# Patient Record
Sex: Female | Born: 1985 | Race: White | Hispanic: No | Marital: Married | Smoking: Former smoker
Health system: Southern US, Community
[De-identification: ages and names within clinical notes are randomized; demographics above are authoritative.]

## PROBLEM LIST (undated history)

## (undated) ENCOUNTER — Inpatient Hospital Stay (HOSPITAL_COMMUNITY): Payer: Self-pay

## (undated) DIAGNOSIS — Z803 Family history of malignant neoplasm of breast: Secondary | ICD-10-CM

## (undated) DIAGNOSIS — E0590 Thyrotoxicosis, unspecified without thyrotoxic crisis or storm: Secondary | ICD-10-CM

## (undated) DIAGNOSIS — I471 Supraventricular tachycardia, unspecified: Secondary | ICD-10-CM

## (undated) DIAGNOSIS — R002 Palpitations: Secondary | ICD-10-CM

## (undated) DIAGNOSIS — R079 Chest pain, unspecified: Secondary | ICD-10-CM

## (undated) HISTORY — DX: Chest pain, unspecified: R07.9

## (undated) HISTORY — DX: Supraventricular tachycardia, unspecified: I47.10

## (undated) HISTORY — DX: Palpitations: R00.2

## (undated) HISTORY — DX: Family history of malignant neoplasm of breast: Z80.3

## (undated) HISTORY — DX: Supraventricular tachycardia: I47.1

## (undated) HISTORY — DX: Thyrotoxicosis, unspecified without thyrotoxic crisis or storm: E05.90

## (undated) HISTORY — PX: NO PAST SURGERIES: SHX2092

---

## 2019-08-25 ENCOUNTER — Emergency Department (HOSPITAL_COMMUNITY)
Admission: EM | Admit: 2019-08-25 | Discharge: 2019-08-25 | Disposition: A | Discharge status: Home / Self Care | Payer: Managed Care, Other (non HMO) | Attending: Emergency Medicine | Admitting: Emergency Medicine

## 2019-08-25 ENCOUNTER — Encounter (HOSPITAL_COMMUNITY): Payer: Self-pay | Admitting: Emergency Medicine

## 2019-08-25 ENCOUNTER — Other Ambulatory Visit: Payer: Self-pay

## 2019-08-25 DIAGNOSIS — R0789 Other chest pain: Secondary | ICD-10-CM | POA: Diagnosis not present

## 2019-08-25 DIAGNOSIS — I479 Paroxysmal tachycardia, unspecified: Secondary | ICD-10-CM | POA: Insufficient documentation

## 2019-08-25 DIAGNOSIS — I471 Supraventricular tachycardia: Secondary | ICD-10-CM

## 2019-08-25 DIAGNOSIS — R002 Palpitations: Secondary | ICD-10-CM | POA: Diagnosis present

## 2019-08-25 DIAGNOSIS — F17210 Nicotine dependence, cigarettes, uncomplicated: Secondary | ICD-10-CM | POA: Diagnosis not present

## 2019-08-25 LAB — TROPONIN I (HIGH SENSITIVITY)
Troponin I (High Sensitivity): 5 ng/L (ref <18)
Troponin I (High Sensitivity): 5 ng/L (ref <18)

## 2019-08-25 LAB — BASIC METABOLIC PANEL
Anion gap: 12 (ref 5–15)
BUN: 11 mg/dL (ref 6–20)
CO2: 18 mmol/L — ABNORMAL LOW (ref 22–32)
Calcium: 9.5 mg/dL (ref 8.9–10.3)
Chloride: 111 mmol/L (ref 98–111)
Creatinine, Ser: 0.79 mg/dL (ref 0.44–1.00)
GFR calc Af Amer: >60 mL/min (ref >60)
GFR calc non Af Amer: >60 mL/min (ref >60)
Glucose, Bld: 98 mg/dL (ref 70–99)
Potassium: 3.7 mmol/L (ref 3.5–5.1)
Sodium: 141 mmol/L (ref 135–145)

## 2019-08-25 LAB — CBC
HCT: 41.6 % (ref 36.0–46.0)
Hemoglobin: 14.2 g/dL (ref 12.0–15.0)
MCH: 33.1 pg (ref 26.0–34.0)
MCHC: 34.1 g/dL (ref 30.0–36.0)
MCV: 97 fL (ref 80.0–100.0)
Platelets: 236 10*3/uL (ref 150–400)
RBC: 4.29 MIL/uL (ref 3.87–5.11)
RDW: 12.1 % (ref 11.5–15.5)
WBC: 7.1 10*3/uL (ref 4.0–10.5)
nRBC: 0 % (ref 0.0–0.2)

## 2019-08-25 LAB — I-STAT BETA HCG BLOOD, ED (MC, WL, AP ONLY): I-stat hCG, quantitative: <5 m[IU]/mL (ref <5)

## 2019-08-25 LAB — TSH: TSH: 2.006 u[IU]/mL (ref 0.350–4.500)

## 2019-08-25 MED ORDER — SODIUM CHLORIDE 0.9% FLUSH: 3.0000 mL | Freq: Once | INTRAVENOUS | Status: DC

## 2019-08-25 MED ORDER — ADENOSINE 6 MG/2ML IV SOLN
6.0000 mg | Freq: Once | INTRAVENOUS | Status: DC
  Filled 2019-08-25: qty 2

## 2019-08-25 MED ORDER — ADENOSINE 6 MG/2ML IV SOLN
INTRAVENOUS | Status: AC
  Filled 2019-08-25: qty 4

## 2019-08-25 MED ORDER — ADENOSINE 6 MG/2ML IV SOLN
12.0000 mg | Freq: Once | INTRAVENOUS | Status: DC
  Filled 2019-08-25: qty 4

## 2019-08-25 NOTE — ED Provider Notes (Signed)
Attestation: Medical screening examination/treatment/procedure(s) were conducted as a shared visit with non-physician practitioner(s) and myself.  I personally evaluated the patient during the encounter.   Briefly, the patient is a 34 y.o. female  here for tachycardia and associated chest discomfort found to be in SVT.   Vitals:   08/25/19 0400 08/25/19 0445  BP: 102/72 109/78  Pulse: 72 73  Resp: 17 19  Temp:    SpO2: 98% 98%    CONSTITUTIONAL: Well-appearing, NAD NEURO:  Alert and oriented x 3, no focal deficits EYES:  pupils equal and reactive ENT/NECK:  trachea midline, no JVD CARDIO: Tacky rate,, well-perfused PULM: None labored breathing GI/GU:  Abdomin non-distended MSK/SPINE:  No gross deformities, no edema SKIN:  no rash, atraumatic PSYCH:  Appropriate speech and behavior   EKG Interpretation  Date/Time:  Thursday August 25 2019 04:05:55 EDT Ventricular Rate:  83 PR Interval:    QRS Duration: 99 QT Interval:  382 QTC Calculation: 449 R Axis:   75 Text Interpretation: Sinus rhythm Low voltage, precordial leads RSR' in V1 or V2, right VCD or RVH Nonspecific T abnrm, anterolateral leads RATE IMPROVED AND NOW SINUS Confirmed by Drema Pry [phone]) on 08/25/2019 4:34:29 AM       EKG confirmed SVT. No significant electrolyte derangements or anemia.  Thyroid panel within normal limits.  Multiple vagal maneuvers attempted.  Patient was provided with IV fluids.  On my assessment patient's heart rate had improved from the 170s to the 120s but she was still in a normal sinus/reentry pattern.  I placed the patient in Trendelenburg and continue to hydrate.  Instructed patient to perform breathing exercises.  Within 30 minutes, patient converted to normal sinus rhythm.  Repeat EKG as above.  Stable for discharge.  CRITICAL CARE Performed by: Amadeo Garnet [name] Total critical care time: 15 minutes Critical care time was exclusive of separately billable procedures and  treating other patients. Critical care was necessary to treat or prevent imminent or life-threatening deterioration. Critical care was time spent personally by me on the following activities: development of treatment plan with patient and/or surrogate as well as nursing, discussions with consultants, evaluation of patient's response to treatment, examination of patient, obtaining history from patient or surrogate, ordering and performing treatments and interventions, ordering and review of laboratory studies, ordering and review of radiographic studies, pulse oximetry and re-evaluation of patient's condition.       Tiffany Conn, MD 08/25/19 2244

## 2019-08-25 NOTE — Discharge Instructions (Addendum)
Your heart rhythm has spontaneously converted back into a normal rhythm.  Due to the extended length of your symptoms tonight, and the fact that you have had the symptoms in the past, I recommend that you be seen by cardiologist.  Please contact the group listed.  If your symptoms change or worsen, please return to the emergency department.  It would be advisable to avoid stimulants such as caffeine and energy drinks in the meantime.

## 2019-08-25 NOTE — ED Triage Notes (Signed)
Patient reports central chest pain with palpitations and SOB onset this evening , HR = 170's at triage , no emesis or diaphoresis .

## 2019-08-25 NOTE — ED Provider Notes (Signed)
[facility] EMERGENCY DEPARTMENT Provider Note   CSN: [id number] Arrival date & time: 08/25/19  0134     History Chief Complaint  Patient presents with  . Chest Pain  . Palpitations    Tiffany Wolf is a 34 y.o. female.  Patient presents to the emergency department with a chief complaint of chest pain, shortness of breath, and heart palpitations.  She states that symptoms started around 11 PM tonight.  She has history of the same.  She states that she has always been able to get the symptoms to stop after using vagal maneuvers.  She has never required medical intervention.  She states that she had a half a cup of coffee and some tea earlier this morning.  She denies any stimulant use or energy drinks tonight.  She states that she did drink half a glass of wine.  She denies any other associated symptoms.  The history is provided by the patient. No language interpreter was used.       History reviewed. No pertinent past medical history.  There are no problems to display for this patient.   History reviewed. No pertinent surgical history.   OB History   No obstetric history on file.     No family history on file.  Social History   Tobacco Use  . Smoking status: Current Every Day Smoker  . Smokeless tobacco: Never Used  Substance Use Topics  . Alcohol use: Never  . Drug use: Never    Home Medications Prior to Admission medications   Not on File    Allergies    Penicillins  Review of Systems   Review of Systems  All other systems reviewed and are negative.   Physical Exam Updated Vital Signs BP 125/75 (BP Location: Right Arm)   Pulse (!) 175   Temp 97.6 F (36.4 C) (Oral)   Resp 20   Ht 5\' 3"  (1.6 m)   Wt 55 kg   LMP 08/24/2019   SpO2 100%   BMI 21.48 kg/m   Physical Exam Vitals and nursing note reviewed.  Constitutional:      General: She is not in acute distress.    Appearance: She is well-developed.  HENT:     Head:  Normocephalic and atraumatic.  Eyes:     Conjunctiva/sclera: Conjunctivae normal.  Cardiovascular:     Rate and Rhythm: Regular rhythm. Tachycardia present.     Heart sounds: No murmur.  Pulmonary:     Effort: Pulmonary effort is normal. No respiratory distress.     Breath sounds: Normal breath sounds.  Abdominal:     Palpations: Abdomen is soft.     Tenderness: There is no abdominal tenderness.  Musculoskeletal:     Cervical back: Neck supple.  Skin:    General: Skin is warm and dry.  Neurological:     Mental Status: She is alert.     ED Results / Procedures / Treatments   Labs (all labs ordered are listed, but only abnormal results are displayed) Labs Reviewed  CBC  BASIC METABOLIC PANEL  TSH  I-STAT BETA HCG BLOOD, ED ([facility], [facility], [facility] ONLY)  TROPONIN I (HIGH SENSITIVITY)    EKG EKG Interpretation  Date/Time:  Thursday August 25 2019 04:05:55 EDT Ventricular Rate:  83 PR Interval:    QRS Duration: 99 QT Interval:  382 QTC Calculation: 449 R Axis:   75 Text Interpretation: Sinus rhythm Low voltage, precordial leads RSR' in V1 or V2, right VCD or  RVH Nonspecific T abnrm, anterolateral leads RATE IMPROVED AND NOW SINUS Confirmed by Drema Pry [phone]) on 08/25/2019 4:34:29 AM  ED ECG REPORT  I personally interpreted this EKG   Date: 08/25/2019   Rate: 183  Rhythm: supraventricular tachycardia (SVT)  QRS Axis: normal  Intervals: *  ST/T Wave abnormalities: nonspecific ST/T changes  Conduction Disutrbances:none  Narrative Interpretation:   Old EKG Reviewed: none available   Radiology No results found.  Procedures .Critical Care Performed by: Roxy Horseman, PA-C Authorized by: Roxy Horseman, PA-C   Critical care provider statement:    Critical care time (minutes):  35   Critical care was necessary to treat or prevent imminent or life-threatening deterioration of the following conditions:  Circulatory failure   Critical care was time spent personally by  me on the following activities:  Discussions with consultants, evaluation of patient's response to treatment, examination of patient, ordering and performing treatments and interventions, ordering and review of laboratory studies, ordering and review of radiographic studies, pulse oximetry, re-evaluation of patient's condition, obtaining history from patient or surrogate and review of old charts   (including critical care time)  Medications Ordered in ED Medications  sodium chloride flush (NS) 0.9 % injection 3 mL (has no administration in time range)  adenosine (ADENOCARD) 6 MG/2ML injection 6 mg (has no administration in time range)  adenosine (ADENOCARD) 6 MG/2ML injection 12 mg (has no administration in time range)  adenosine (ADENOCARD) 6 MG/2ML injection (has no administration in time range)    ED Course  I have reviewed the triage vital signs and the nursing notes.  Pertinent labs & imaging results that were available during my care of the patient were reviewed by me and considered in my medical decision making (see chart for details).    MDM Rules/Calculators/A&P                      This patient complains of chest pain, SOB, and palpitations, this involves an extensive number of treatment options, and is a complaint that carries with it a high risk of complications and morbidity.  The differential diagnosis includes dysrhythmia, pulmonary embolism, anxiety, toxidrome, etc.  I ordered, reviewed, and interpreted labs, which included CBC which is normal, basic metabolic panel without any electrolyte derangement, i-STAT hCG is negative, troponins negative x2, and TSH which was normal. I ordered medication normal saline to assist with rate control.  Initial EKG shows SVT with rates in the 180s.  She has failed vagal maneuvers and other noninvasive modalities.  We will start fluids.  Will prep for, cardioversion with adenosine.  Patient seen by discussed with Dr. Eudelia Bunch, heart rate is  slowing, she is now in the 120s.  We will continue with IV fluid and will reassess.  Patient reassessed, she spontaneously converted back into normal sinus rhythm.  With reassuring labs, and no other concerning features or findings tonight, feel the patient can be safely discharged at this time.  Patient understands and agrees the plan.  She is stable and ready for discharge.  Final Clinical Impression(s) / ED Diagnoses Final diagnoses:  SVT (supraventricular tachycardia) [facility])    Rx / DC Orders ED Discharge Orders    None       Roxy Horseman, PA-C 08/25/19 0458    [name], MD 08/25/19 2244

## 2019-08-25 NOTE — ED Notes (Signed)
Family at bedside. 

## 2019-09-01 ENCOUNTER — Encounter: Payer: Self-pay | Admitting: Interventional Cardiology

## 2019-09-01 ENCOUNTER — Ambulatory Visit: Payer: Managed Care, Other (non HMO) | Admitting: Interventional Cardiology

## 2019-09-01 ENCOUNTER — Other Ambulatory Visit: Payer: Self-pay

## 2019-09-01 VITALS — BP 100/50 | HR 75 | Ht 63.0 in | Wt 122.0 lb

## 2019-09-01 DIAGNOSIS — I471 Supraventricular tachycardia: Secondary | ICD-10-CM | POA: Diagnosis not present

## 2019-09-01 MED ORDER — METOPROLOL TARTRATE 25 MG PO TABS
25.0000 mg | ORAL_TABLET | Freq: Two times a day (BID) | ORAL | 3 refills | Status: AC | PRN
Start: 2019-09-01 — End: ?

## 2019-09-01 NOTE — Patient Instructions (Signed)
Medication Instructions:  Your physician has recommended you make the following change in your medication:   START: metoprolol tartrate (lopressor) 25 mg by mouth twice a day AS NEEDED for palpitations  *If you need a refill on your cardiac medications before your next appointment, please call your pharmacy*   Lab Work: None ordered  If you have labs (blood work) drawn today and your tests are completely normal, you will receive your results only by: Marland Kitchen MyChart Message (if you have MyChart) OR . A paper copy in the mail If you have any lab test that is abnormal or we need to change your treatment, we will call you to review the results.   Testing/Procedures: Your physician has requested that you have an echocardiogram. Echocardiography is a painless test that uses sound waves to create images of your heart. It provides your doctor with information about the size and shape of your heart and how well your heart's chambers and valves are working. This procedure takes approximately one hour. There are no restrictions for this procedure.   Follow-Up: You have been referred to follow up with Electrophysiology for SVT in 3 months  Other Instructions

## 2019-09-01 NOTE — Progress Notes (Signed)
Cardiology Office Note   Date:  09/01/2019   ID:  Tiffany Wolf, DOB 1985/11/29, MRN [id number]  PCP:  Patient, No Pcp Per    No chief complaint on file.  palpitations  Wt Readings from Last 3 Encounters:  09/01/19 122 lb (55.3 kg)  08/25/19 121 lb 4.1 oz (55 kg)       History of Present Illness: Tiffany Wolf is a 34 y.o. female who is being seen today for the evaluation of palpitations at the request of [place],*.  She has had palpitations intermittently for many years.  She has had short episodes, managed with Valsalva and time.  Longest was 30 minutes before.   She went to the ER due to palpitations.  No excess caffeine.  She had SVT with rate of 183 at 1 AM.  Slowed to 126 an hour later.  By 4AM, it was back to NSR.     Past Medical History:  Diagnosis Date  . Chest pain   . Palpitations   . SVT (supraventricular tachycardia) (HCC)     No past surgical history on file.   Current Outpatient Medications  Medication Sig Dispense Refill  . fluticasone (FLONASE) 50 MCG/ACT nasal spray Place 2 sprays into both nostrils daily as needed for allergies or rhinitis.    Marland Kitchen ibuprofen (ADVIL) 200 MG tablet Take 600-800 mg by mouth 2 (two) times daily as needed for headache or moderate pain.     No current facility-administered medications for this visit.    Allergies:   Penicillins    Social History:  The patient  reports that she has been smoking. She has never used smokeless tobacco. She reports that she does not drink alcohol or use drugs.   Family History:  The patient's family history includes Heart attack in her father.    ROS:  Please see the history of present illness.   Otherwise, review of systems are positive for episodes of palpitations, stress from overwork.   All other systems are reviewed and negative.    PHYSICAL EXAM: VS:  BP (!) 100/50   Pulse 75   Ht 5\' 3"  (1.6 m)   Wt 122 lb (55.3 kg)   LMP 08/24/2019   SpO2 97%   BMI 21.61 kg/m   , BMI Body mass index is 21.61 kg/m. GEN: Well nourished, well developed, in no acute distress  HEENT: normal  Neck: no JVD, carotid bruits, or masses Cardiac: RRR; no murmurs, rubs, or gallops,no edema  Respiratory:  clear to auscultation bilaterally, normal work of breathing GI: soft, nontender, nondistended, + BS MS: no deformity or atrophy  Skin: warm and dry, no rash Neuro:  Strength and sensation are intact Psych: euthymic mood, full affect   EKG:   The ekg ordered 08/25/19 demonstrates SVT, followed slower rate and then followed by NSR   Recent Labs: 08/25/2019: BUN 11; Creatinine, Ser 0.79; Hemoglobin 14.2; Platelets 236; Potassium 3.7; Sodium 141; TSH 2.006   Lipid Panel No results found for: CHOL, TRIG, HDL, CHOLHDL, VLDL, LDLCALC, LDLDIRECT   Other studies Reviewed: Additional studies/ records that were reviewed today with results demonstrating: ER records reviewed; TSH normal.   ASSESSMENT AND PLAN:  1. SVT: PLan for metoprolol 25 mg PO BID prn palpitations.  If she has more palpitations, would refer to EP for ablation.  I don't think we will be able to titrate metoprolol to a high dose due to her relatively low BP.  2. Check echo to r/o  structural heart disease    Current medicines are reviewed at length with the patient today.  The patient concerns regarding her medicines were addressed.  The following changes have been made:  No change  Labs/ tests ordered today include:  No orders of the defined types were placed in this encounter.   Recommend 150 minutes/week of aerobic exercise Low fat, low carb, high fiber diet recommended  Disposition:   FU in 3 months with EP;  I suspect she will need ablation at some point   Signed, Lance Muss, MD  09/01/2019 4:11 PM    [facility] [address] [place], [place], [place]  [address] Phone: [phone]; Fax: [phone]

## 2019-09-09 ENCOUNTER — Encounter: Payer: Self-pay | Admitting: Interventional Cardiology

## 2019-09-26 ENCOUNTER — Other Ambulatory Visit: Payer: Self-pay

## 2019-09-26 ENCOUNTER — Ambulatory Visit (HOSPITAL_COMMUNITY): Payer: Managed Care, Other (non HMO) | Attending: Cardiology

## 2019-09-26 DIAGNOSIS — I471 Supraventricular tachycardia: Secondary | ICD-10-CM | POA: Diagnosis present

## 2021-08-20 ENCOUNTER — Other Ambulatory Visit: Payer: Self-pay | Admitting: Obstetrics and Gynecology

## 2021-08-20 DIAGNOSIS — N6320 Unspecified lump in the left breast, unspecified quadrant: Secondary | ICD-10-CM

## 2021-08-21 ENCOUNTER — Other Ambulatory Visit: Payer: Self-pay | Admitting: Obstetrics and Gynecology

## 2021-08-21 DIAGNOSIS — N6320 Unspecified lump in the left breast, unspecified quadrant: Secondary | ICD-10-CM

## 2021-09-04 ENCOUNTER — Ambulatory Visit
Admission: RE | Admit: 2021-09-04 | Discharge: 2021-09-04 | Disposition: A | Discharge status: Home / Self Care | Payer: Managed Care, Other (non HMO) | Source: Ambulatory Visit | Attending: Obstetrics and Gynecology | Admitting: Obstetrics and Gynecology

## 2021-09-04 ENCOUNTER — Other Ambulatory Visit: Payer: Self-pay | Admitting: Obstetrics and Gynecology

## 2021-09-04 DIAGNOSIS — N6310 Unspecified lump in the right breast, unspecified quadrant: Secondary | ICD-10-CM

## 2021-09-04 DIAGNOSIS — N6320 Unspecified lump in the left breast, unspecified quadrant: Secondary | ICD-10-CM

## 2021-12-15 ENCOUNTER — Emergency Department (HOSPITAL_BASED_OUTPATIENT_CLINIC_OR_DEPARTMENT_OTHER)
Admission: EM | Admit: 2021-12-15 | Discharge: 2021-12-15 | Disposition: A | Discharge status: Home / Self Care | Payer: Managed Care, Other (non HMO) | Attending: Emergency Medicine | Admitting: Emergency Medicine

## 2021-12-15 ENCOUNTER — Emergency Department (HOSPITAL_BASED_OUTPATIENT_CLINIC_OR_DEPARTMENT_OTHER): Payer: Managed Care, Other (non HMO)

## 2021-12-15 ENCOUNTER — Encounter (HOSPITAL_BASED_OUTPATIENT_CLINIC_OR_DEPARTMENT_OTHER): Payer: Self-pay

## 2021-12-15 ENCOUNTER — Other Ambulatory Visit: Payer: Self-pay

## 2021-12-15 DIAGNOSIS — R059 Cough, unspecified: Secondary | ICD-10-CM | POA: Diagnosis present

## 2021-12-15 DIAGNOSIS — G9331 Postviral fatigue syndrome: Secondary | ICD-10-CM | POA: Insufficient documentation

## 2021-12-15 DIAGNOSIS — J40 Bronchitis, not specified as acute or chronic: Secondary | ICD-10-CM | POA: Diagnosis not present

## 2021-12-15 DIAGNOSIS — Z20822 Contact with and (suspected) exposure to covid-19: Secondary | ICD-10-CM | POA: Diagnosis not present

## 2021-12-15 DIAGNOSIS — R058 Other specified cough: Secondary | ICD-10-CM

## 2021-12-15 LAB — RESP PANEL BY RT-PCR (FLU A&B, COVID) ARPGX2
Influenza A by PCR: NEGATIVE
Influenza B by PCR: NEGATIVE
SARS Coronavirus 2 by RT PCR: NEGATIVE

## 2021-12-15 MED ORDER — ALBUTEROL SULFATE HFA 108 (90 BASE) MCG/ACT IN AERS: 1.0000 | INHALATION_SPRAY | Freq: Four times a day (QID) | RESPIRATORY_TRACT | 0 refills | Status: AC | PRN

## 2021-12-15 MED ORDER — PREDNISONE 20 MG PO TABS: 40.0000 mg | ORAL_TABLET | Freq: Every day | ORAL | 0 refills | Status: DC

## 2021-12-15 NOTE — ED Provider Notes (Signed)
[facility] EMERGENCY DEPT Provider Note   CSN: [id number] Arrival date & time: 12/15/21  1735     History  Chief Complaint  Patient presents with   Cough    Tiffany Wolf is a 36 y.o. female with past medical history significant for previous SVT who presents with concern for fall 2 weeks ago with positive eighth rib fracture.  Patient reports that she had a cough that began a few days before her fall, and she has had ongoing if not worsening fall since then.  She was seen evaluated urgent care earlier today, had a chest x-ray which showed concern for possible small pleural effusion versus hemothorax.  She was advised to come here for a chest CT to further evaluate.  She denies any fever, chills, nausea, vomiting, reports that her sore throat is overall improved but her cough is ongoing.  She has been taking over-the-counter cough drops and honey with no significant improvement of her cough.   Cough      Home Medications Prior to Admission medications   Medication Sig Start Date End Date Taking? Authorizing Provider  albuterol (VENTOLIN HFA) 108 (90 Base) MCG/ACT inhaler Inhale 1-2 puffs into the lungs every 6 (six) hours as needed for wheezing or shortness of breath. 12/15/21  Yes Quindon Denker H, PA-C  predniSONE (DELTASONE) 20 MG tablet Take 2 tablets (40 mg total) by mouth daily. 12/15/21  Yes Bridgette Wolden H, PA-C  fluticasone (FLONASE) 50 MCG/ACT nasal spray Place 2 sprays into both nostrils daily as needed for allergies or rhinitis.    [provider]  ibuprofen (ADVIL) 200 MG tablet Take 600-800 mg by mouth 2 (two) times daily as needed for headache or moderate pain.    [provider]  metoprolol tartrate (LOPRESSOR) 25 MG tablet Take 1 tablet (25 mg total) by mouth 2 (two) times daily as needed (palpitations). 09/01/19   Corky Crafts, MD      Allergies    Penicillins    Review of Systems   Review of Systems  Respiratory:   Positive for cough.   All other systems reviewed and are negative.   Physical Exam Updated Vital Signs BP 116/76 (BP Location: Right Arm)   Pulse 82   Temp 98.7 F (37.1 C) (Oral)   Resp 18   Ht 5\' 3"  (1.6 m)   Wt 55.3 kg   SpO2 100%   BMI 21.60 kg/m  Physical Exam Vitals and nursing note reviewed.  Constitutional:      General: She is not in acute distress.    Appearance: Normal appearance.  HENT:     Head: Normocephalic and atraumatic.  Eyes:     General:        Right eye: No discharge.        Left eye: No discharge.  Cardiovascular:     Rate and Rhythm: Normal rate and regular rhythm.     Heart sounds: No murmur heard.    No friction rub. No gallop.  Pulmonary:     Effort: Pulmonary effort is normal.     Breath sounds: Normal breath sounds.     Comments: Occasional somewhat wet cough. TTP right lateral chest wall.  No wheezing, rhonchi, rales, focal consolidation noted.  No significant respiratory distress. Abdominal:     General: Bowel sounds are normal.     Palpations: Abdomen is soft.  Skin:    General: Skin is warm and dry.     Capillary Refill: Capillary refill  takes less than 2 seconds.  Neurological:     Mental Status: She is alert and oriented to person, place, and time.  Psychiatric:        Mood and Affect: Mood normal.        Behavior: Behavior normal.     ED Results / Procedures / Treatments   Labs (all labs ordered are listed, but only abnormal results are displayed) Labs Reviewed  RESP PANEL BY RT-PCR (FLU A&B, COVID) [id number]    EKG None  Radiology CT Chest Wo Contrast  Result Date: 12/15/2021 CLINICAL DATA:  Productive cough with shortness of breath and fatigue. EXAM: CT CHEST WITHOUT CONTRAST TECHNIQUE: Multidetector CT imaging of the chest was performed following the standard protocol without IV contrast. RADIATION DOSE REDUCTION: This exam was performed according to the departmental dose-optimization program which includes automated  exposure control, adjustment of the mA and/or kV according to patient size and/or use of iterative reconstruction technique. COMPARISON:  None Available. FINDINGS: Cardiovascular: No significant vascular findings. Normal heart size. A small, predominantly posteromedial pericardial effusion is noted. Mediastinum/Nodes: Subcentimeter AP window and pretracheal lymph nodes are seen. Thyroid gland, trachea, and esophagus demonstrate no significant findings. Lungs/Pleura: Mild biapical scarring and/or atelectasis is seen, right greater than left. A 7 mm noncalcified pleural based lung nodule is seen within the posterior aspect of the left lower lobe (axial CT image 97, CT series 4). An additional 5 mm noncalcified left lower lobe lung nodule is noted (axial CT image 109, CT series 4). Mild linear scarring and/or atelectasis is seen within the posteromedial aspect of the left lower lobe. There is no evidence of a pleural effusion or pneumothorax. Upper Abdomen: A 10 mm diameter cyst is seen within the right lobe of the liver. Musculoskeletal: No chest wall mass or suspicious bone lesions identified. IMPRESSION: 1. Small, predominantly posteromedial pericardial effusion. 2. Mild biapical scarring and/or atelectasis, right greater than left. 3. Two noncalcified left lower lobe lung nodules, the largest of which measures 7 mm. Non-contrast chest CT at 3-6 months is recommended. If the nodules are stable at time of repeat CT, then future CT at 18-24 months (from today's scan) is considered optional for low-risk patients, but is recommended for high-risk patients. This recommendation follows the consensus statement: Guidelines for Management of Incidental Pulmonary Nodules Detected on CT Images: From the Fleischner Society 2017; Radiology 2017; 284:228-243. Electronically Signed   By: Virgina Norfolk M.D.   On: 12/15/2021 19:38    Procedures Procedures    Medications Ordered in ED Medications - No data to display  ED  Course/ Medical Decision Making/ A&P                           Medical Decision Making Amount and/or Complexity of Data Reviewed Radiology: ordered.   This patient is a 36 y.o. female who presents to the ED for concern of cough, possible fluid on chest x-ray, status post known rib fracture, this involves an extensive number of treatment options, and is a complaint that carries with it a high risk of complications and morbidity. The emergent differential diagnosis prior to evaluation includes, but is not limited to, pneumonia, acute bronchitis, postviral cough syndrome, asthma or COPD exacerbation, versus hemothorax, pleural effusion versus other.   This is not an exhaustive differential.   Past Medical History / Co-morbidities / Social History: Patient does vape tobacco, denies smoke tobacco use  Additional history: Chart reviewed. Pertinent results include:  Reviewed the imaging, and evaluation in the urgent care earlier today  Physical Exam: Physical exam performed. The pertinent findings include: Patient with occasional wet cough, no significant wheezing on my evaluation  Lab Tests: I ordered, and personally interpreted labs.  The pertinent results include: COVID, flu PCR is negative   Imaging Studies: I ordered imaging studies including ET chest without contrast. I independently visualized and interpreted imaging which showed small posterolateral pericardial effusion, some lung nodules noted with recommendation for repeat CT scan in 3 to 6 months, no evidence of hemothorax, or significant pleural effusion. I agree with the radiologist interpretation.   Disposition: After consideration of the diagnostic results and the patients response to treatment, I feel that patient's symptoms are consistent with postviral cough versus ongoing bronchitis.  As it has been 2+ weeks in the setting of known rib fracture with ongoing cough, and wet cough on my exam today to be reasonable to treat with an  inhaler to help with shortness of breath, and patient reported wheezing as well as short burst of steroids to help with lung inflammation.   emergency department workup does not suggest an emergent condition requiring admission or immediate intervention beyond what has been performed at this time. The plan is: Patient to establish primary care doctor to evaluate her lung nodules, encouraged her to stop vaping, we will treat her cough as discussed above. The patient is safe for discharge and has been instructed to return immediately for worsening symptoms, change in symptoms or any other concerns.  I discussed this case with my attending physician Dr. Rosalia Hammers who cosigned this note including patient's presenting symptoms, physical exam, and planned diagnostics and interventions. Attending physician stated agreement with plan or made changes to plan which were implemented.    Final Clinical Impression(s) / ED Diagnoses Final diagnoses:  Post-viral cough syndrome  Bronchitis    Rx / DC Orders ED Discharge Orders          Ordered    albuterol (VENTOLIN HFA) 108 (90 Base) MCG/ACT inhaler  Every 6 hours PRN        12/15/21 2012    predniSONE (DELTASONE) 20 MG tablet  Daily        12/15/21 2012              [place] 12/15/21 2017    Margarita Grizzle, MD 12/17/21 1246

## 2021-12-15 NOTE — Discharge Instructions (Addendum)
Here is what your CT showed:  Two noncalcified left lower lobe lung nodules, the largest of  which measures 7 mm. Non-contrast chest CT at 3-6 months is  recommended. If the nodules are stable at time of repeat CT, then  future CT at 18-24 months (from today's scan) is considered optional  for low-risk patients, but is recommended for high-risk patients.  This recommendation follows the consensus statement: Guidelines for  Management of Incidental Pulmonary Nodules Detected on CT Images:  From the Fleischner Society 2017; Radiology 2017; 284:228-243.   Please establish a primary care doctor at your earliest convenience to follow-up on the above findings.  In the meantime you can try the albuterol inhaler and short course of steroids as we discussed for your postviral cough/acute bronchitis.  Continue use to your incentive spirometer until you are cough and rib pain free.  Please return to the emergency department if you have worsening symptoms, difficulty breathing, chest pain.

## 2021-12-15 NOTE — ED Triage Notes (Signed)
Patient here POV from Home.  Endorses falling approximately 2 Weeks ago. Patient was assessed by [facility] a few days after the Fall and was informed she had Fractured Ribs and was given a Facilities manager.   Patient visited UC today as Patient has been experiencing a Productive Cough, Fatigue.   No Confirmed Fevers. Still Some Discomfort to Ribs from Fall.   NAD Noted during Triage. A&Ox4. GCS 15. Ambulatory.

## 2021-12-15 NOTE — ED Notes (Signed)
RT educated pt on smoking cessation. Pt stated she utilizes e-cigarettes regularly. RT educated the pt on various methods of quitting and methods available to her. Pt also educated on VILI r/t e-cigarettes and vaping. Pt receptive to teaching and verbalizes understanding of information provided.

## 2022-04-30 ENCOUNTER — Other Ambulatory Visit: Payer: Self-pay | Admitting: Nurse Practitioner

## 2022-04-30 DIAGNOSIS — J984 Other disorders of lung: Secondary | ICD-10-CM

## 2022-06-03 ENCOUNTER — Ambulatory Visit: Payer: Managed Care, Other (non HMO)

## 2022-07-14 ENCOUNTER — Ambulatory Visit
Admission: RE | Admit: 2022-07-14 | Discharge: 2022-07-14 | Disposition: A | Discharge status: Home / Self Care | Payer: Managed Care, Other (non HMO) | Source: Ambulatory Visit | Attending: Nurse Practitioner | Admitting: Nurse Practitioner

## 2022-07-14 DIAGNOSIS — J984 Other disorders of lung: Secondary | ICD-10-CM

## 2022-11-12 IMAGING — US US BREAST*R* LIMITED INC AXILLA
1 series · 11 of 11 positions shown · non-contrast
Comparison: Prior exams are not available, performed [facility]
COMPARISON: Prior exams are not available, performed at [facility]

Addendum:
CLINICAL DATA: Palpable abnormality in the RIGHT breast, confirmed
by patient's provider. No palpable abnormality on the LEFT,
confirmed with patient by technologist.

EXAM:
DIGITAL DIAGNOSTIC BILATERAL MAMMOGRAM WITH TOMOSYNTHESIS AND CAD;
ULTRASOUND RIGHT BREAST LIMITED
TECHNIQUE: Bilateral digital diagnostic mammography and breast tomosynthesis
was performed. The images were evaluated with computer-aided
detection.; Targeted ultrasound examination of the right breast was
performed

[Series 1: us breast*right* limited inc axilla · 0.06mm/px · 11 of 11 slices shown]
[im 1/11]
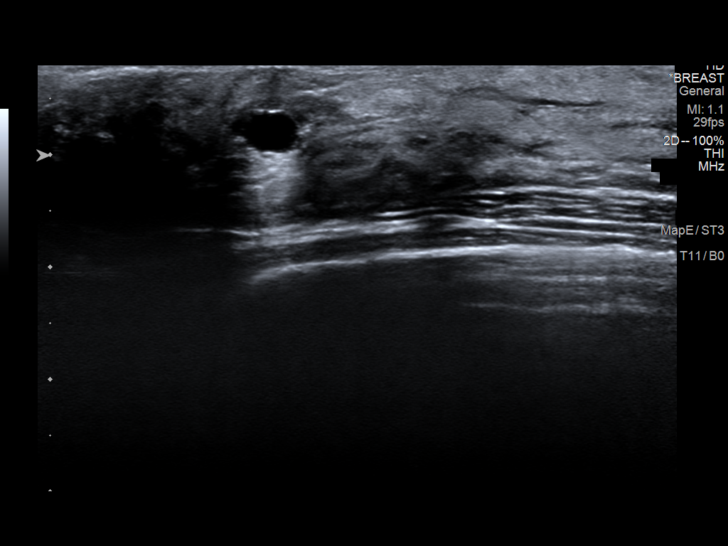
[im 2/11]
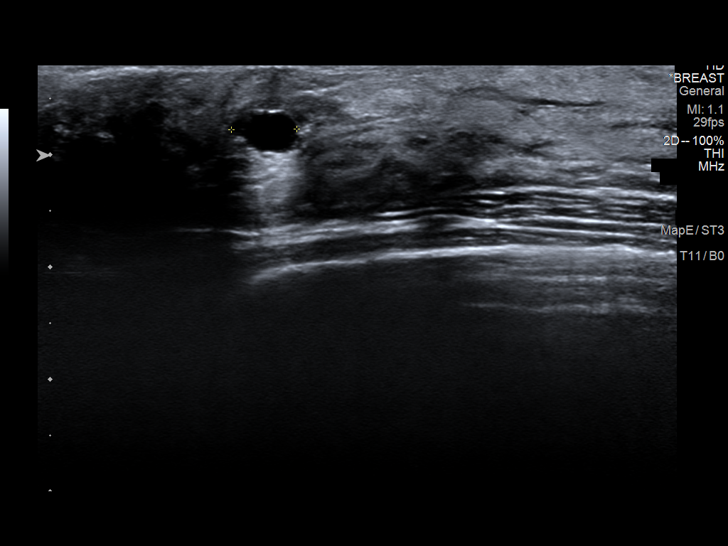
[im 3/11]
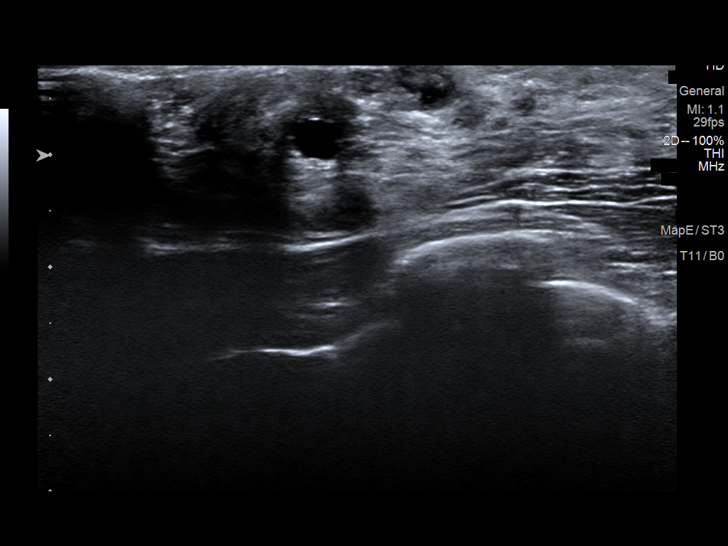
[im 4/11]
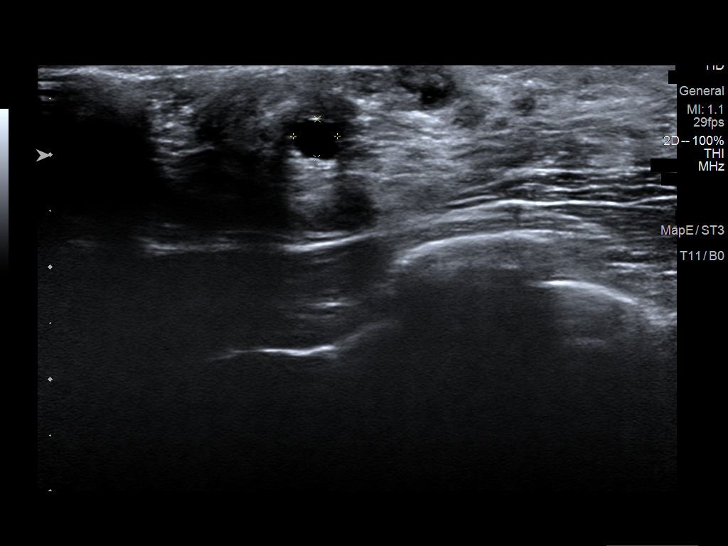
[im 5/11]
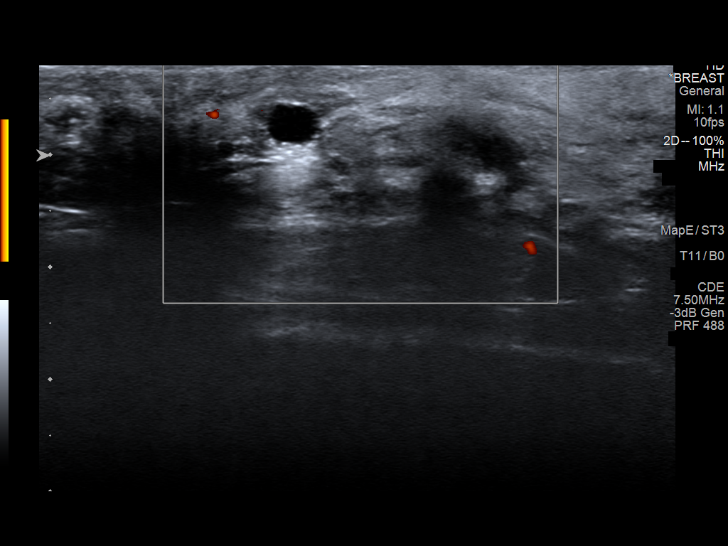
[im 6/11]
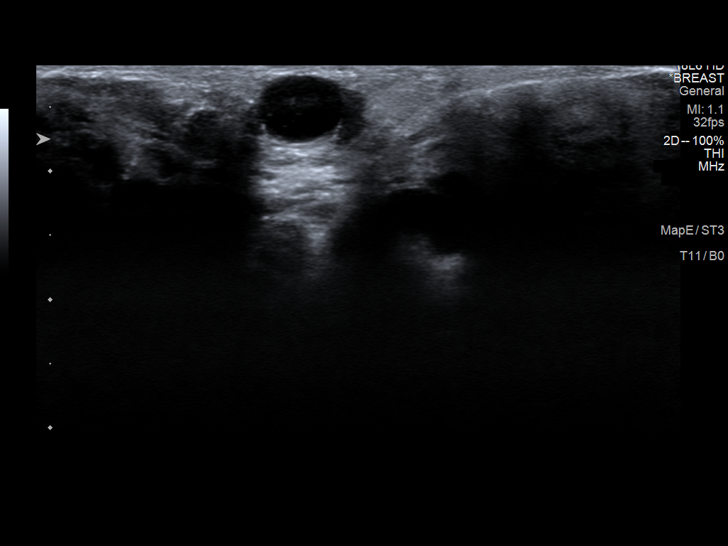
[im 7/11]
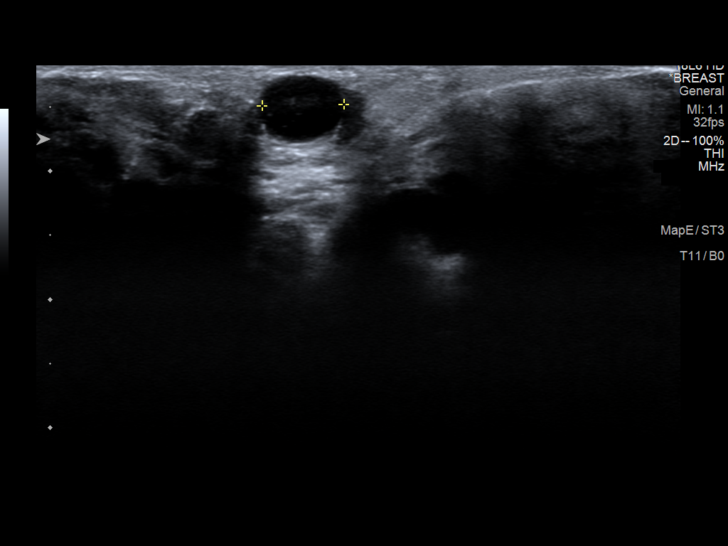
[im 8/11]
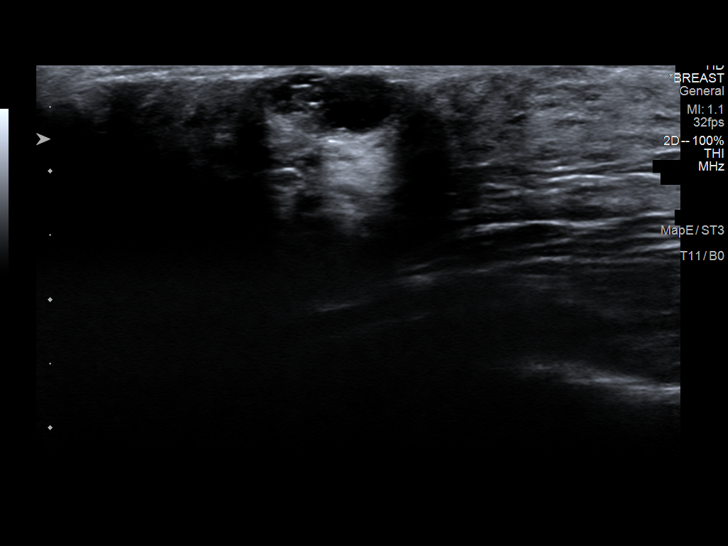
[im 9/11]
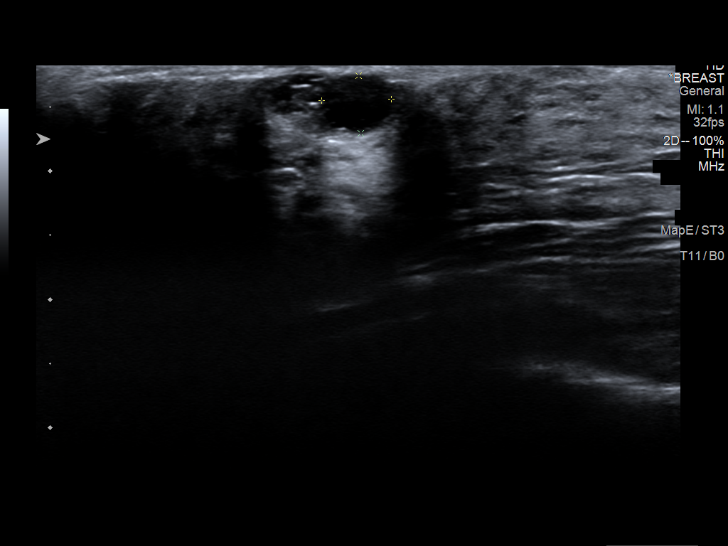
[im 10/11]
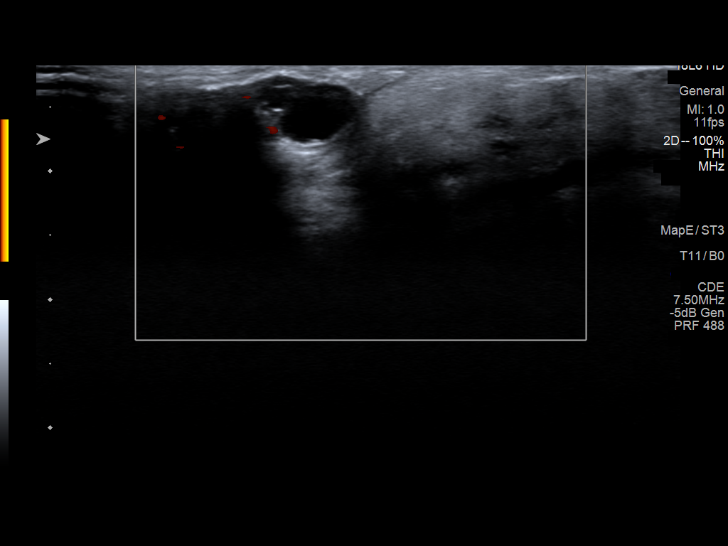
[im 11/11]
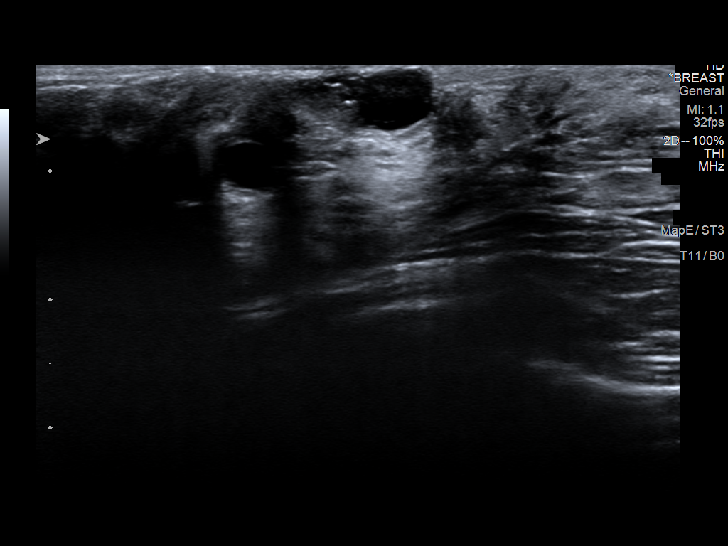

[11 of 11 positions shown; findings below may reference images not displayed]

ACR Breast Density Category d: The breast tissue is extremely dense,
which lowers the sensitivity of mammography.
FINDINGS: LEFT breast is negative.

BB marks the area concern in the UPPER-OUTER QUADRANT of the RIGHT
breast. Spot tangential view shows a partially obscured oval mass in
the area of concern. No associated distortion or suspicious
microcalcifications.

On physical exam, I palpate a small oval mobile smooth mass in the
UPPER-OUTER QUADRANT of the RIGHT breast.

Targeted ultrasound is performed, showing adjacent simple cysts
within the 10 o'clock location of the RIGHT breast 5 centimeters
from the nipple, measuring up to 0.6 centimeters. No solid component
or areas of acoustic shadowing.
IMPRESSION: Palpable cysts in the UPPER-OUTER QUADRANT of the RIGHT breast. No
mammographic or ultrasound evidence for malignancy.

RECOMMENDATION:
If prior studies are obtained, addendum can be made regarding
further recommendations. Otherwise, recommend screening mammogram at
age 40 unless there are persistent or intervening clinical concerns.
(Code:BA-W-O3E)

I have discussed the findings and recommendations with the patient.
If applicable, a reminder letter will be sent to the patient
regarding the next appointment.

BI-RADS CATEGORY  2: Benign.

ADDENDUM:
Comparison is now made with previous exams, performed at [facility]. No interval changes to suggest
malignancy.

Recommendation: Recommend screening mammogram at age 40 unless there
are persistent or intervening clinical concerns. (Code:BA-[id number])

BI-RADS: 2: Benign.

*** End of Addendum ***
ACR Breast Density Category d: The breast tissue is extremely dense,
which lowers the sensitivity of mammography.
FINDINGS: LEFT breast is negative.

BB marks the area concern in the UPPER-OUTER QUADRANT of the RIGHT
breast. Spot tangential view shows a partially obscured oval mass in
the area of concern. No associated distortion or suspicious
microcalcifications.

On physical exam, I palpate a small oval mobile smooth mass in the
UPPER-OUTER QUADRANT of the RIGHT breast.

Targeted ultrasound is performed, showing adjacent simple cysts
within the 10 o'clock location of the RIGHT breast 5 centimeters
from the nipple, measuring up to 0.6 centimeters. No solid component
or areas of acoustic shadowing.
IMPRESSION: Palpable cysts in the UPPER-OUTER QUADRANT of the RIGHT breast. No
mammographic or ultrasound evidence for malignancy.

RECOMMENDATION:
If prior studies are obtained, addendum can be made regarding
further recommendations. Otherwise, recommend screening mammogram at
age 38 unless there are persistent or intervening clinical concerns.
(Code:[id number])

I have discussed the findings and recommendations with the patient.
If applicable, a reminder letter will be sent to the patient
regarding the next appointment.

BI-RADS CATEGORY  2: Benign.

## 2022-11-12 IMAGING — MG DIGITAL DIAGNOSTIC BILAT W/ TOMO W/ CAD
6 of 10 series · 6 of 30 positions shown · non-contrast
Comparison: Prior exams are not available, performed at [facility]
COMPARISON: Prior exams are not available, performed at [facility]

Addendum:
CLINICAL DATA: Palpable abnormality in the RIGHT breast, confirmed
by patient's provider. No palpable abnormality on the LEFT,
confirmed with patient by technologist.

EXAM:
DIGITAL DIAGNOSTIC BILATERAL MAMMOGRAM WITH TOMOSYNTHESIS AND CAD;
ULTRASOUND RIGHT BREAST LIMITED
TECHNIQUE: Bilateral digital diagnostic mammography and breast tomosynthesis
was performed. The images were evaluated with computer-aided
detection.; Targeted ultrasound examination of the right breast was
performed

[R CC synth-2D]
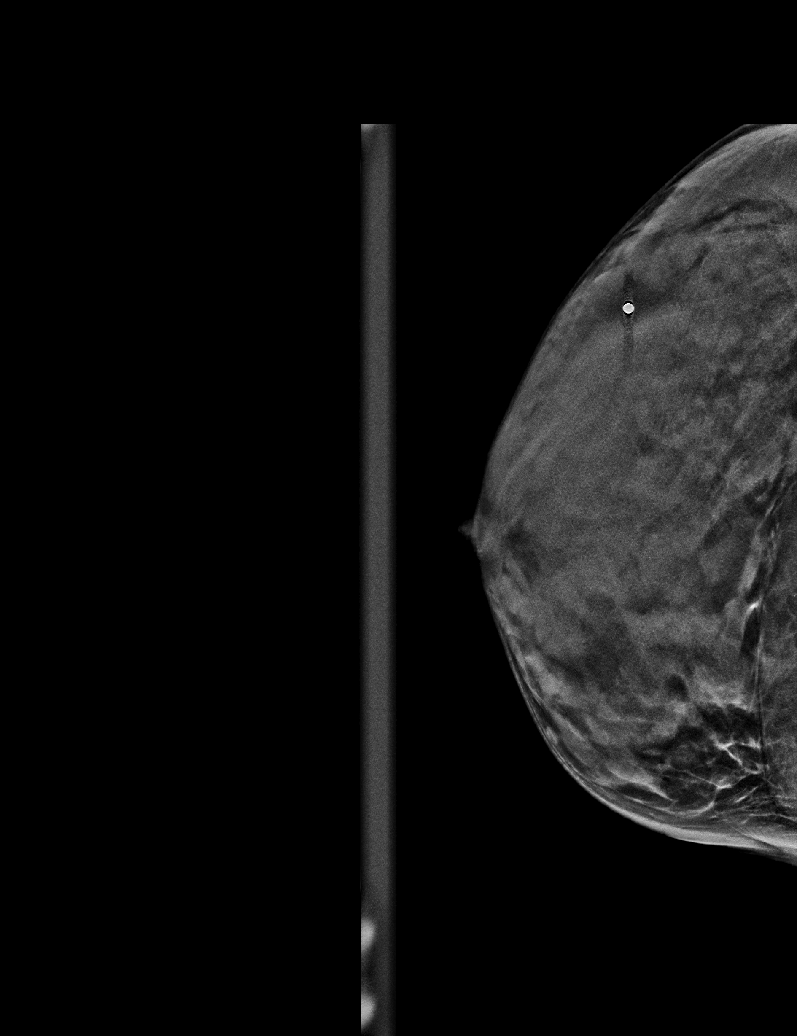

[R TAN synth-2D]
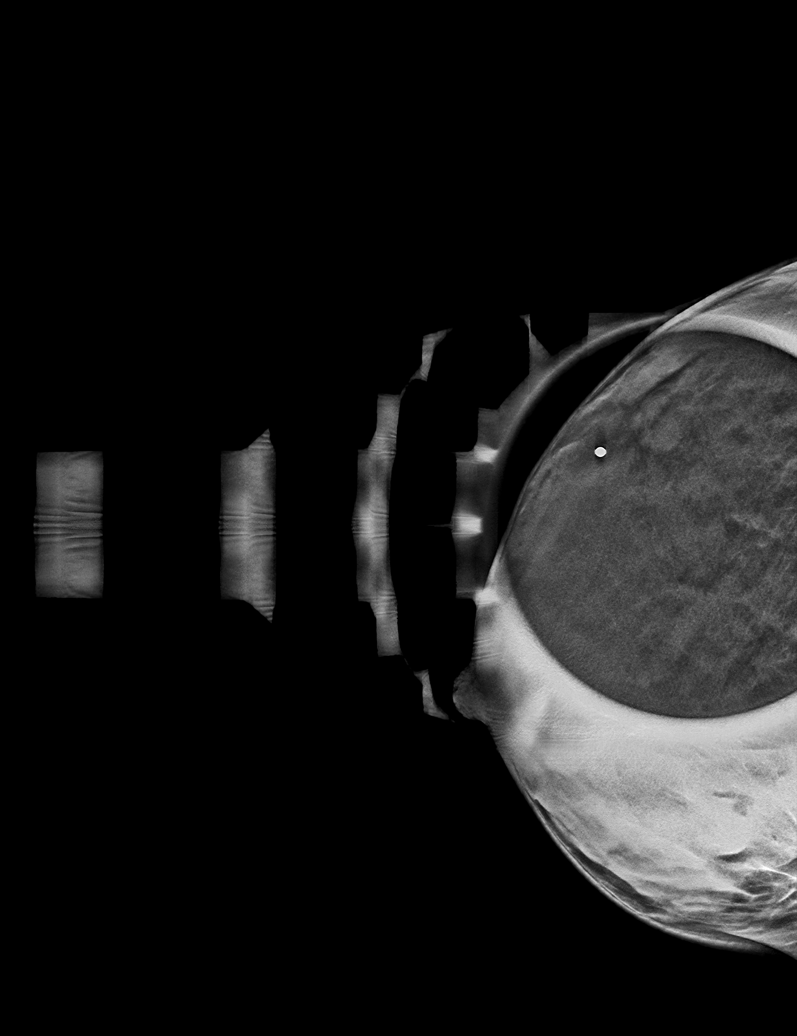

[L MLO synth-2D]
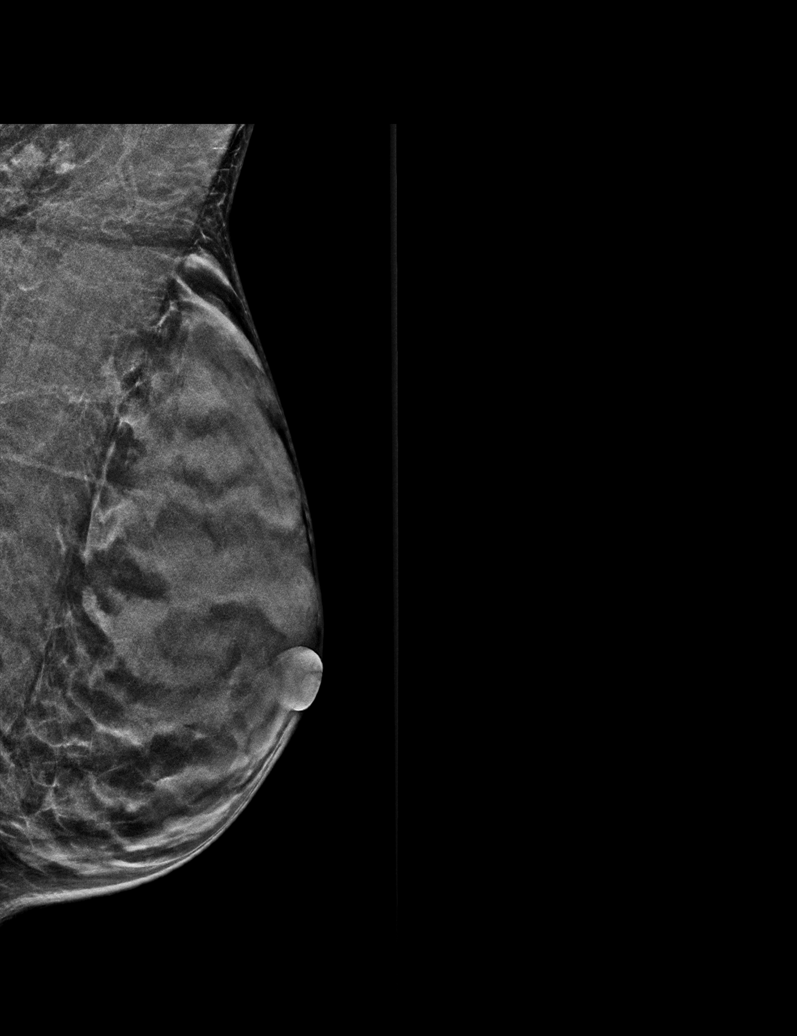

[R MLO synth-2D]
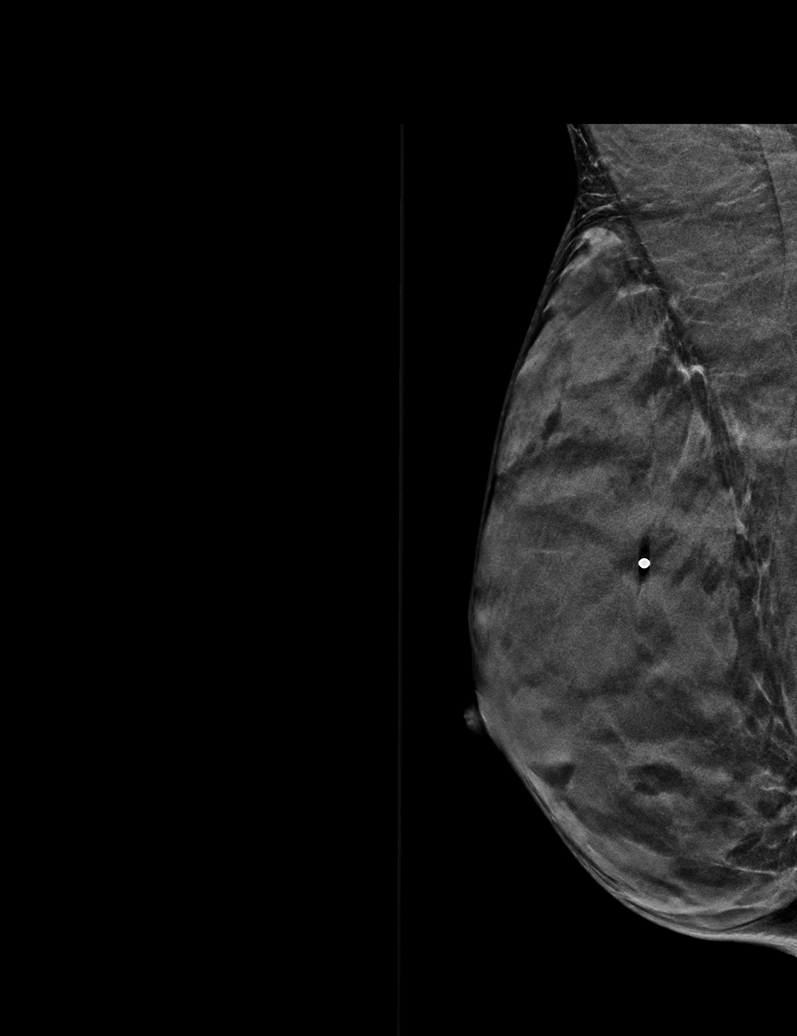

[L CC synth-2D]
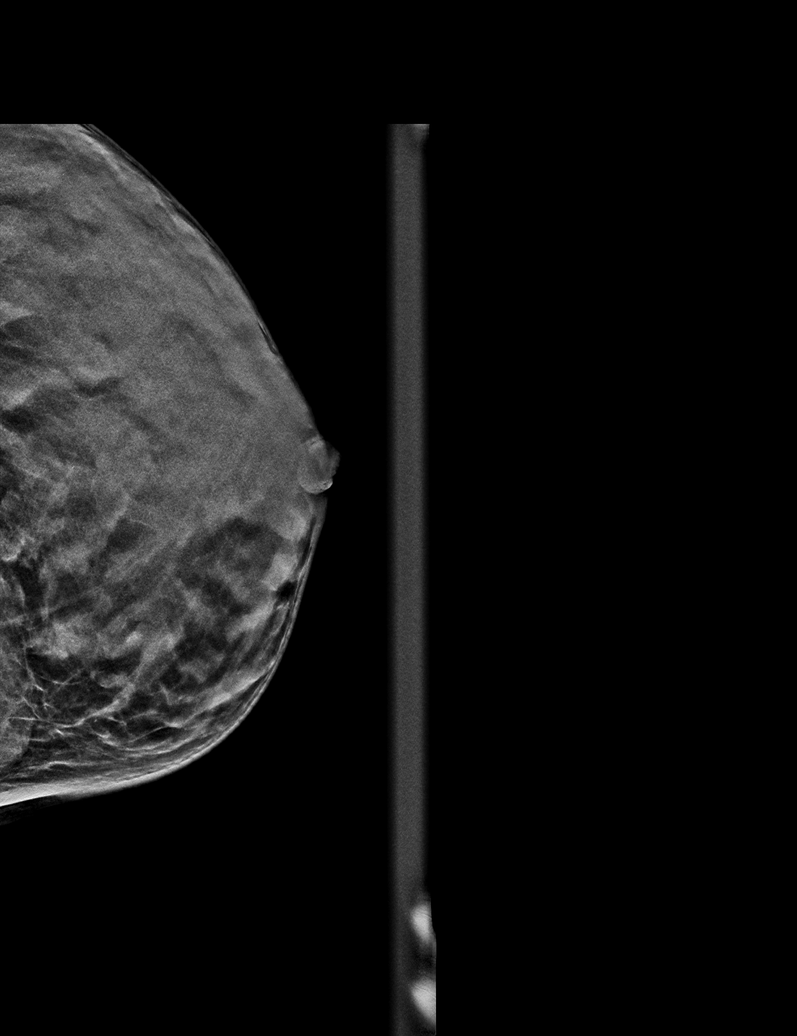

[R TAN tomo · tomo slice 17/32.0]
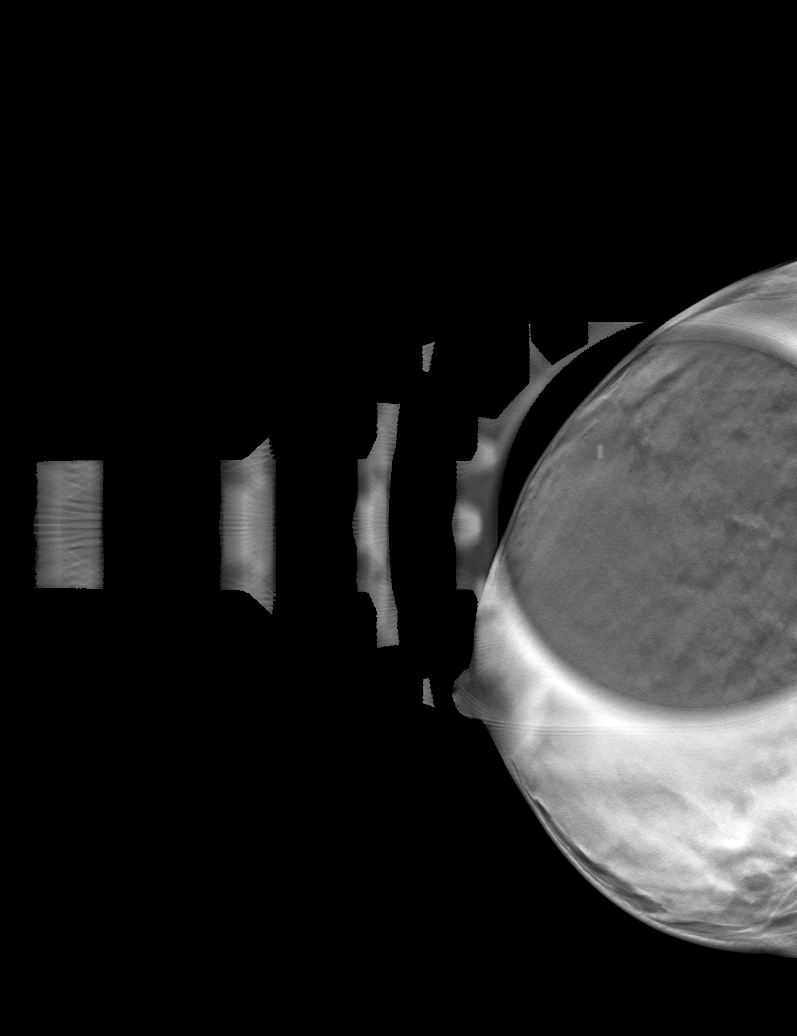

[6 of 30 positions shown; findings below may reference images not displayed]

ACR Breast Density Category d: The breast tissue is extremely dense,
which lowers the sensitivity of mammography.
FINDINGS: LEFT breast is negative.

BB marks the area concern in the UPPER-OUTER QUADRANT of the RIGHT
breast. Spot tangential view shows a partially obscured oval mass in
the area of concern. No associated distortion or suspicious
microcalcifications.

On physical exam, I palpate a small oval mobile smooth mass in the
UPPER-OUTER QUADRANT of the RIGHT breast.

Targeted ultrasound is performed, showing adjacent simple cysts
within the 10 o'clock location of the RIGHT breast 5 centimeters
from the nipple, measuring up to 0.6 centimeters. No solid component
or areas of acoustic shadowing.
IMPRESSION: Palpable cysts in the UPPER-OUTER QUADRANT of the RIGHT breast. No
mammographic or ultrasound evidence for malignancy.

RECOMMENDATION:
If prior studies are obtained, addendum can be made regarding
further recommendations. Otherwise, recommend screening mammogram at
age 40 unless there are persistent or intervening clinical concerns.
(Code:BA-[id number])

I have discussed the findings and recommendations with the patient.
If applicable, a reminder letter will be sent to the patient
regarding the next appointment.

BI-RADS CATEGORY  2: Benign.

ADDENDUM:
Comparison is now made with previous exams, performed at [facility]. No interval changes to suggest
malignancy.

Recommendation: Recommend screening mammogram at age 40 unless there
are persistent or intervening clinical concerns. (Code:[id number]-[id number])

BI-RADS: 2: Benign.

*** End of Addendum ***
ACR Breast Density Category d: The breast tissue is extremely dense,
which lowers the sensitivity of mammography.
FINDINGS: LEFT breast is negative.

BB marks the area concern in the UPPER-OUTER QUADRANT of the RIGHT
breast. Spot tangential view shows a partially obscured oval mass in
the area of concern. No associated distortion or suspicious
microcalcifications.

On physical exam, I palpate a small oval mobile smooth mass in the
UPPER-OUTER QUADRANT of the RIGHT breast.

Targeted ultrasound is performed, showing adjacent simple cysts
within the 10 o'clock location of the RIGHT breast 5 centimeters
from the nipple, measuring up to 0.6 centimeters. No solid component
or areas of acoustic shadowing.
IMPRESSION: Palpable cysts in the UPPER-OUTER QUADRANT of the RIGHT breast. No
mammographic or ultrasound evidence for malignancy.

RECOMMENDATION:
If prior studies are obtained, addendum can be made regarding
further recommendations. Otherwise, recommend screening mammogram at
age 38 unless there are persistent or intervening clinical concerns.
(Code:BA-[id number])

I have discussed the findings and recommendations with the patient.
If applicable, a reminder letter will be sent to the patient
regarding the next appointment.

BI-RADS CATEGORY  2: Benign.

## 2023-05-26 ENCOUNTER — Other Ambulatory Visit: Payer: Self-pay

## 2023-05-26 ENCOUNTER — Inpatient Hospital Stay (HOSPITAL_COMMUNITY): Payer: Managed Care, Other (non HMO)

## 2023-05-26 ENCOUNTER — Inpatient Hospital Stay (HOSPITAL_COMMUNITY)
Admission: AD | Admit: 2023-05-26 | Discharge: 2023-05-26 | Disposition: A | Discharge status: Home / Self Care | Payer: Managed Care, Other (non HMO) | Attending: Obstetrics and Gynecology | Admitting: Obstetrics and Gynecology

## 2023-05-26 ENCOUNTER — Encounter (HOSPITAL_COMMUNITY): Payer: Self-pay | Admitting: Obstetrics and Gynecology

## 2023-05-26 DIAGNOSIS — F17290 Nicotine dependence, other tobacco product, uncomplicated: Secondary | ICD-10-CM | POA: Insufficient documentation

## 2023-05-26 DIAGNOSIS — O034 Incomplete spontaneous abortion without complication: Secondary | ICD-10-CM | POA: Insufficient documentation

## 2023-05-26 DIAGNOSIS — O99331 Smoking (tobacco) complicating pregnancy, first trimester: Secondary | ICD-10-CM | POA: Diagnosis present

## 2023-05-26 MED ORDER — LIDOCAINE HCL (PF) 1 % IJ SOLN
30.0000 mL | Freq: Once | INTRAMUSCULAR | Status: DC
  Filled 2023-05-26: qty 30

## 2023-05-26 MED ORDER — LIDOCAINE HCL 1 % IJ SOLN
20.0000 mL | Freq: Once | INTRAMUSCULAR | Status: AC
  Administered 2023-05-26: 20 mL

## 2023-05-26 MED ORDER — ONDANSETRON HCL 4 MG/2ML IJ SOLN
4.0000 mg | Freq: Once | INTRAMUSCULAR | Status: AC
  Administered 2023-05-26: 4 mg via INTRAVENOUS
  Filled 2023-05-26: qty 2

## 2023-05-26 MED ORDER — HYDROMORPHONE HCL 1 MG/ML IJ SOLN
1.0000 mg | Freq: Once | INTRAMUSCULAR | Status: DC
Start: 2023-05-26 — End: 2023-05-26
  Filled 2023-05-26: qty 1

## 2023-05-26 MED ORDER — LORAZEPAM 2 MG/ML IJ SOLN
1.0000 mg | Freq: Four times a day (QID) | INTRAMUSCULAR | Status: DC | PRN
  Administered 2023-05-26: 1 mg via INTRAVENOUS
  Filled 2023-05-26: qty 1

## 2023-05-26 MED ORDER — HYDROMORPHONE HCL 1 MG/ML IJ SOLN
1.0000 mg | Freq: Once | INTRAMUSCULAR | Status: DC
Start: 2023-05-26 — End: 2023-05-26

## 2023-05-26 MED ORDER — KETOROLAC TROMETHAMINE 30 MG/ML IJ SOLN
30.0000 mg | Freq: Once | INTRAMUSCULAR | Status: AC
  Administered 2023-05-26: 30 mg via INTRAVENOUS
  Filled 2023-05-26: qty 1

## 2023-05-26 MED ORDER — SILVER NITRATE-POT NITRATE 75-25 % EX MISC
1.0000 | Freq: Once | CUTANEOUS | Status: DC
Start: 2023-05-26 — End: 2023-05-26
  Filled 2023-05-26: qty 10

## 2023-05-26 MED ORDER — DOXYCYCLINE HYCLATE 100 MG PO TABS
200.0000 mg | ORAL_TABLET | Freq: Once | ORAL | Status: AC
  Administered 2023-05-26: 200 mg via ORAL
  Filled 2023-05-26 (×2): qty 2

## 2023-05-26 NOTE — Procedures (Signed)
OPERATIVE NOTE  PREOPERATIVE DIAGNOSIS:  Incomplete abortion.   POSTOPERATIVE DIAGNOSIS:  Incomplete abortion.   PROCEDURE:  Manual vacuum aspiration  SURGEON:  Ambrose Mantle, MD  ANESTHESIA:  Paracervical block with 20 cc 1% lidocaine.  SPECIMEN: Products of conception- sent to pathology  EBL:  5cc.   COMPLICATIONS:  None.   INDICATIONS FOR THE PROCEDURE:  Tiffany Wolf was diagnosed with an incomplete abortion and a plan was made to proceed with a manual vacuum aspiration procedure after the risks, benefits, and alternatives were reviewed (see my H&P for details).   FINDINGS:    Pre-procedure: Pelvic exam reveals an 8w sized uterus with no adnexal masses. Small amount of blood in the vagina. Cervix approximately 0.5cm dilated.  Intra-op: A moderate amount of products of conception were obtained.   Post-procedure: Limited bedside ultrasound with thin endometrial stripe. Hemostatic tenaculum site. Scant bleeding from the cervix. Small, firm retroverted uterus on bimanual exam.  PROCEDURE:  In the procedure room, she was placed in the dorsal lithotomy position.   An bimanual exam was performed.  See the findings above.   A speculum was placed in the vagina and a tenaculum was placed on the posterior cervical lip. A paracervical block was administered with 10 cc 1% lidocaine both at 4 and 8 o'clock.  The cervix was easily serially dilated to 9mm. An 8mm cannula was placed inside the uterine cavity.  Proper pressure was achieved with the manual vacuum aspirator, and the cannula was turned in a clockwise fashion while it was removed slowly from the uterine cavity. POC tissue was obtained. This was repeated twice more, until no more products of conception or blood came into the MVA. The 8mm cannula was removed and replaced with a 6mm cannula. A gentle aspiration curettage was performed until the endometrial lining was uniformly gritty. Limited bedside ultrasound was performed  demonstrating thin endometrial stripe.  The instruments were removed from the vaginal after hemostasis was confirmed. Repeat bimanual exam was normal.   There were no complications to the procedure.  All sponge, instrument, and needle counts were correct.   The patient tolerated the procedure very well. She was in stable condition when I left the room. Doxycycline 200 mg PO is ordered, for her to receive prior to discharge.She will be monitored by nursing staff for one hour and then discharged if pain and bleeding are well controlled. She was given post-procedure precautions and instructions.   SIGNED:  Ambrose Mantle, MD
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
## 2023-05-26 NOTE — MAU Provider Note (Signed)
Chief Complaint: Abdominal Pain and Vaginal Bleeding   Event Date/Time   First Provider Initiated Contact with Patient 05/26/23 0348        SUBJECTIVE HPI: Tiffany Wolf is a 38 y.o. No obstetric history on file. at Unknown by LMP who presents to maternity admissions reporting having been diagnosed with a missed abortion last week (IVF pregnancy) and took Cytotec for that.  Had some bleeding but tonight developed severe cramping. . She denies urinary symptoms, h/a, dizziness, n/v, or fever/chills.     Abdominal Pain This is a recurrent problem. The current episode started in the past 7 days. The problem occurs constantly. The pain is located in the suprapubic region. The pain is at a severity of 10/10. The quality of the pain is cramping. Pertinent negatives include no diarrhea, dysuria, fever, frequency or vomiting. Nothing aggravates the pain. The pain is relieved by Nothing. She has tried nothing for the symptoms.   RN note: Pt says she took meds for SAB on Wed - Has VB - has light red VB now Cramping is 10/10- took Ibuprofen at 10 and Tyle at 0030  Past Medical History:  Diagnosis Date   Chest pain    Palpitations    SVT (supraventricular tachycardia) (HCC)    No past surgical history on file. Social History   Socioeconomic History   Marital status: Married    Spouse name: Not on file   Number of children: Not on file   Years of education: Not on file   Highest education level: Not on file  Occupational History   Not on file  Tobacco Use   Smoking status: Every Day    Types: E-cigarettes   Smokeless tobacco: Never  Substance and Sexual Activity   Alcohol use: Yes    Comment: Seldom   Drug use: Never   Sexual activity: Not on file  Other Topics Concern   Not on file  Social History Narrative   Not on file   Social Drivers of Health   Financial Resource Strain: Not on file  Food Insecurity: Not on file  Transportation Needs: Not on file  Physical Activity: Not  on file  Stress: Not on file  Social Connections: Unknown (09/02/2021)   Received from [facility]   Social Network    Social Network: Not on file  Intimate Partner Violence: Unknown (08/08/2021)   Received from [facility], [facility]   HITS    Physically Hurt: Not on file    Insult or Talk Down To: Not on file    Threaten Physical Harm: Not on file    Scream or Curse: Not on file   No current facility-administered medications on file prior to encounter.   Current Outpatient Medications on File Prior to Encounter  Medication Sig Dispense Refill   albuterol (VENTOLIN HFA) 108 (90 Base) MCG/ACT inhaler Inhale 1-2 puffs into the lungs every 6 (six) hours as needed for wheezing or shortness of breath. 8 g 0   fluticasone (FLONASE) 50 MCG/ACT nasal spray Place 2 sprays into both nostrils daily as needed for allergies or rhinitis.     ibuprofen (ADVIL) 200 MG tablet Take 600-800 mg by mouth 2 (two) times daily as needed for headache or moderate pain.     metoprolol tartrate (LOPRESSOR) 25 MG tablet Take 1 tablet (25 mg total) by mouth 2 (two) times daily as needed (palpitations). 180 tablet 3   predniSONE (DELTASONE) 20 MG tablet Take 2 tablets (40 mg  total) by mouth daily. 10 tablet 0   Allergies  Allergen Reactions   Penicillins     I have reviewed patient's Past Medical Hx, Surgical Hx, Family Hx, Social Hx, medications and allergies.   ROS:  Review of Systems  Constitutional:  Negative for fever.  Gastrointestinal:  Positive for abdominal pain. Negative for diarrhea and vomiting.  Genitourinary:  Negative for dysuria and frequency.   Review of Systems  Other systems negative   Physical Exam  Physical Exam Patient Vitals for the past 24 hrs:  BP Temp Temp src Pulse Resp Height Weight  05/26/23 0324 116/74 97.9 F (36.6 C) Oral 70 18 5\' 4"  (1.626 m) 60 kg   Constitutional: Well-developed, well-nourished female in no acute distress.  Cardiovascular:  normal rate Respiratory: normal effort GI: Abd soft, non-tender. Pos BS x 4 MS: Extremities nontender, no edema, normal ROM Neurologic: Alert and oriented x 4.  GU: Neg CVAT.  PELVIC EXAM: deferred In lieu of transvaginal ultrasound  LAB RESULTS      IMAGING US OB Transvaginal Result Date: 05/26/2023 CLINICAL DATA:  The patient took abortion medication six days ago now with cramping pelvic pain. Assess for retained products of conception. EXAM: TRANSVAGINAL OB ULTRASOUND TECHNIQUE: Transvaginal ultrasound was performed for complete evaluation of the gestation as well as the maternal uterus, adnexal regions, and pelvic cul-de-sac. COMPARISON:  None Available. FINDINGS: Intrauterine gestational sac: Single, but is abnormally elongate and appears large for the size fetus. Yolk sac:  Not Visualized. Embryo:  Visualized. Cardiac Activity: Not Visualized. MSD: Technologist did not measure. CRL:   15.6 mm   8 w 0 d Subchorionic hemorrhage:  None visualized. Maternal uterus/adnexae: The uterus is retroverted. The uterus measures 10.0 x 5.3 x 4.7 cm. No wall mass is evident. The cervix was not specifically evaluated but images which include the internal os suggest it is probably closed. Both ovaries are sonographically unremarkable. No free fluid is evident. IMPRESSION: 1. Pulseless fetus measuring 8 weeks consistent with intrauterine demise. 2. Retained products of conception are confirmed. The gestational sac appears large for the size of the fetus. 3. The cervix was not specifically evaluated but it appears to be closed where visible. 4. Unremarkable ovaries, adnexal spaces. Electronically Signed   By: Almira Bar M.D.   On: 05/26/2023 04:03     MAU Management/MDM: I have reviewed the triage vital signs and the nursing notes.   Pertinent labs & imaging results that were available during my care of the patient were reviewed by me and considered in my medical decision making (see chart for  details).      I have reviewed her medical records including past results, notes and treatments. Medical, Surgical, and family history were reviewed.  Medications and recent lab tests were reviewed  Ordered Ultrasound to rule out retained products  Consult Dr Clint Lipps with presentation, exam findings, and results.   Treatments in MAU included Saline lock and analgesic (Dilaudid).    ASSESSMENT S[place]gle IUP at [redacted]w[redacted]d by IVF Missed abortion  Status post Cytotec with retained products  PLAN Dr Queen Blossom here to discuss options Patient opted for MVA to be done here on unit Supplies gathered Care turned over to Dr Beatrix Shipper.    Wynelle Bourgeois CNM, MSN Certified Nurse-Midwife 05/26/2023  3:48 AM

## 2023-05-27 LAB — SURGICAL PATHOLOGY

## 2023-11-19 LAB — OB RESULTS CONSOLE HEPATITIS B SURFACE ANTIGEN: Hepatitis B Surface Ag: NEGATIVE

## 2023-11-19 LAB — OB RESULTS CONSOLE ANTIBODY SCREEN: Antibody Screen: POSITIVE

## 2023-11-19 LAB — OB RESULTS CONSOLE GC/CHLAMYDIA
Chlamydia: NEGATIVE
Neisseria Gonorrhea: NEGATIVE

## 2023-11-19 LAB — OB RESULTS CONSOLE HIV ANTIBODY (ROUTINE TESTING): HIV: NONREACTIVE

## 2023-11-19 LAB — OB RESULTS CONSOLE RUBELLA ANTIBODY, IGM: Rubella: IMMUNE

## 2023-11-19 LAB — HEPATITIS C ANTIBODY: HCV Ab: NEGATIVE

## 2023-11-19 LAB — OB RESULTS CONSOLE RPR: RPR: NONREACTIVE

## 2023-12-21 ENCOUNTER — Other Ambulatory Visit: Payer: Self-pay | Admitting: Obstetrics and Gynecology

## 2023-12-21 DIAGNOSIS — Z3183 Encounter for assisted reproductive fertility procedure cycle: Secondary | ICD-10-CM

## 2024-01-20 ENCOUNTER — Encounter: Payer: Self-pay | Admitting: *Deleted

## 2024-01-20 DIAGNOSIS — Z87898 Personal history of other specified conditions: Secondary | ICD-10-CM | POA: Insufficient documentation

## 2024-01-20 DIAGNOSIS — O09529 Supervision of elderly multigravida, unspecified trimester: Secondary | ICD-10-CM | POA: Insufficient documentation

## 2024-02-02 ENCOUNTER — Ambulatory Visit

## 2024-02-02 ENCOUNTER — Other Ambulatory Visit: Payer: Self-pay | Admitting: *Deleted

## 2024-02-02 ENCOUNTER — Ambulatory Visit: Attending: Obstetrics and Gynecology | Admitting: Obstetrics

## 2024-02-02 VITALS — BP 114/64

## 2024-02-02 DIAGNOSIS — Z3A21 21 weeks gestation of pregnancy: Secondary | ICD-10-CM | POA: Diagnosis not present

## 2024-02-02 DIAGNOSIS — O09812 Supervision of pregnancy resulting from assisted reproductive technology, second trimester: Secondary | ICD-10-CM

## 2024-02-02 DIAGNOSIS — O09522 Supervision of elderly multigravida, second trimester: Secondary | ICD-10-CM

## 2024-02-02 DIAGNOSIS — Z7982 Long term (current) use of aspirin: Secondary | ICD-10-CM | POA: Diagnosis not present

## 2024-02-02 DIAGNOSIS — O321XX0 Maternal care for breech presentation, not applicable or unspecified: Secondary | ICD-10-CM | POA: Insufficient documentation

## 2024-02-02 DIAGNOSIS — Z36 Encounter for antenatal screening for chromosomal anomalies: Secondary | ICD-10-CM | POA: Diagnosis not present

## 2024-02-02 DIAGNOSIS — O26892 Other specified pregnancy related conditions, second trimester: Secondary | ICD-10-CM | POA: Diagnosis not present

## 2024-02-02 DIAGNOSIS — R002 Palpitations: Secondary | ICD-10-CM

## 2024-02-02 DIAGNOSIS — Z87898 Personal history of other specified conditions: Secondary | ICD-10-CM

## 2024-02-02 DIAGNOSIS — Z3183 Encounter for assisted reproductive fertility procedure cycle: Secondary | ICD-10-CM

## 2024-02-02 NOTE — Progress Notes (Signed)
 MFM Consult Note  Tiffany Wolf is currently at 21 weeks and 3 days.  Tiffany Wolf was seen due to advanced maternal age (38 years old) and IVF pregnancy.  Tiffany Wolf denies any significant past medical history and denies any problems in her current pregnancy.    Tiffany Wolf had a cell free DNA test earlier in her pregnancy which indicated a low risk for trisomy 4, 65, and 13. A female fetus is predicted.   Sonographic findings Single intrauterine pregnancy at 21w 3d  Fetal cardiac activity:  Observed and appears normal. Presentation: Breech. The anatomic structures that were well seen appear normal without evidence of soft markers. Due to poor acoustic windows some structures remain suboptimally visualized. Fetal biometry shows the estimated fetal weight at the 53rd percentile.  Amniotic fluid: Within normal limits.  MVP: 4.25 cm. Placenta: Anterior. Adnexa: No abnormality visualized. Cervical length: 4.1 cm.  The patient was informed that anomalies may be missed due to technical limitations. If the fetus is in a suboptimal position or maternal habitus is increased, visualization of the fetus in the maternal uterus may be impaired.  Advanced maternal age and IVF pregnancy The increased risk of fetal aneuploidy due to advanced maternal age was discussed.  Due to advanced maternal age, the patient was offered and declined an amniocentesis today for definitive diagnosis of fetal aneuploidy. Tiffany Wolf was comfortable with the low risk indicated by her cell free DNA test. We will continue to follow her with growth ultrasounds throughout her pregnancy. Weekly fetal testing should be started at 34 weeks. Delivery should occur at around 39 weeks. Due to the IVF pregnancy, Tiffany Wolf was referred to [facility] pediatric cardiology for a fetal echocardiogram.   The increased risk of gestational diabetes and preeclampsia due to advanced maternal age was discussed.  Tiffany Wolf was advised to continue taking a daily baby aspirin for preeclampsia  prophylaxis.  Tiffany Wolf will return in 4 weeks for another ultrasound exam.    The patient stated that all of her questions were answered today.  A total of 30 minutes was spent counseling and coordinating the care for this patient.  Greater than 50% of the time was spent in direct face-to-face contact.

## 2024-02-05 ENCOUNTER — Encounter: Payer: Self-pay | Admitting: Cardiology

## 2024-02-05 ENCOUNTER — Ambulatory Visit: Attending: Cardiology | Admitting: Cardiology

## 2024-02-05 VITALS — BP 106/70 | HR 65 | Ht 64.0 in | Wt 148.4 lb

## 2024-02-05 DIAGNOSIS — Z7689 Persons encountering health services in other specified circumstances: Secondary | ICD-10-CM | POA: Diagnosis not present

## 2024-02-05 DIAGNOSIS — I471 Supraventricular tachycardia, unspecified: Secondary | ICD-10-CM | POA: Diagnosis not present

## 2024-02-05 DIAGNOSIS — R002 Palpitations: Secondary | ICD-10-CM | POA: Diagnosis not present

## 2024-02-05 NOTE — Progress Notes (Signed)
 Cardio-Obstetrics Clinic  New Evaluation  Date:  02/05/2024   ID:  Tiffany Wolf, DOB 1986-04-21, MRN [id number]  PCP:  Patient, No Pcp Per   [facility] Providers Cardiologist:  Dub Huntsman, DO  Electrophysiologist:  None       Referring MD: Claire Rubie LABOR, MD   Chief Complaint:  I am having palpations  History of Present Illness:    Tiffany Wolf is a 38 y.o. female [G2P0010] who is being seen today for the evaluation of palpitations at the request of Gavlinski, Rubie LABOR, MD.   The referral the patient has had episodes of palpitations 1 lasting for 8 minutes about a week ago which improved with Valsalva.  She has had to go to the ED for this.  She noted that she wore a monitor as a teenager  She was referred by Dr. Claire for evaluation of SVT.  Palpitations occur randomly, lasting ten to fifteen minutes, with two episodes during the current pregnancy. The last episode before pregnancy was four to six months ago. Episodes begin suddenly, often at rest or standing, and are managed with Valsalva maneuvers, which are effective in terminating them.  Currently [redacted] weeks pregnant with her first child, she has experienced two episodes of palpitations during this pregnancy, with one lasting about eight minutes. No recent echocardiograms have been performed as an adult.  She has borderline hyperthyroidism and experienced palpitations in her youth. No history of hypertension.   Prior CV Studies Reviewed: The following studies were reviewed today:   Past Medical History:  Diagnosis Date   Chest pain    Family history of breast cancer    Hyperthyroidism    Palpitations    SVT (supraventricular tachycardia)     Past Surgical History:  Procedure Laterality Date   NO PAST SURGERIES     WISDOM TOOTH EXTRACTION        OB History     Gravida  2   Para      Term      Preterm      AB  1   Living         SAB  1   IAB      Ectopic      Multiple       Live Births                  Current Medications: Current Meds  Medication Sig   albuterol  (VENTOLIN  HFA) 108 (90 Base) MCG/ACT inhaler Inhale 1-2 puffs into the lungs every 6 (six) hours as needed for wheezing or shortness of breath.   aspirin EC 81 MG tablet Take 81 mg by mouth daily. Swallow whole.   loratadine (CLARITIN) 10 MG tablet Take 10 mg by mouth daily.   Prenatal Vit-Fe Fumarate-FA (PRENATAL MULTIVITAMIN) TABS tablet Take 1 tablet by mouth daily at 12 noon.     Allergies:   Penicillins   Social History   Socioeconomic History   Marital status: Married    Spouse name: Not on file   Number of children: Not on file   Years of education: Not on file   Highest education level: Not on file  Occupational History   Not on file  Tobacco Use   Smoking status: Former    Types: E-cigarettes   Smokeless tobacco: Never  Vaping Use   Vaping status: Former   Substances: Nicotine  Substance and Sexual Activity   Alcohol use: Not Currently    Comment:  Seldom   Drug use: Never   Sexual activity: Not Currently  Other Topics Concern   Not on file  Social History Narrative   Not on file   Social Drivers of Health   Financial Resource Strain: Not on file  Food Insecurity: Not on file  Transportation Needs: Not on file  Physical Activity: Not on file  Stress: Not on file  Social Connections: Unknown (09/02/2021)   Received from [facility]   Social Network    Social Network: Not on file      Family History  Problem Relation Age of Onset   Heart attack Mother    Heart disease Mother    Heart disease Father    Heart attack Father    Breast cancer Sister 96   Cancer Maternal Aunt    Breast cancer Maternal Aunt    Asthma Neg Hx    Diabetes Neg Hx    Hypertension Neg Hx       ROS:   Please see the history of present illness.    Palpitations  All other systems reviewed and are negative.   Labs/EKG Reviewed:    EKG:   EKG was ordered today.  The  ekg ordered today demonstrates sinus rhythm  Recent Labs: No results found for requested labs within last 365 days.   Recent Lipid Panel No results found for: CHOL, TRIG, HDL, CHOLHDL, LDLCALC, LDLDIRECT  Physical Exam:    VS:  BP 106/70 (BP Location: Right Arm, Patient Position: Sitting, Cuff Size: Normal)   Pulse 65   Ht 5' 4 (1.626 m)   Wt 148 lb 6.4 oz (67.3 kg)   SpO2 99%   BMI 25.47 kg/m     Wt Readings from Last 3 Encounters:  02/05/24 148 lb 6.4 oz (67.3 kg)  05/26/23 132 lb 3.2 oz (60 kg)  12/15/21 121 lb 14.6 oz (55.3 kg)     GEN:  Well nourished, well developed in no acute distress HEENT: Normal NECK: No JVD; No carotid bruits LYMPHATICS: No lymphadenopathy CARDIAC: RRR, no murmurs, rubs, gallops RESPIRATORY:  Clear to auscultation without rales, wheezing or rhonchi  ABDOMEN: Soft, non-tender, non-distended MUSCULOSKELETAL:  No edema; No deformity  SKIN: Warm and dry NEUROLOGIC:  Alert and oriented x 3 PSYCHIATRIC:  Normal affect    Risk Assessment/Risk Calculators:                 ASSESSMENT & PLAN:    Supraventricular tachycardia (SVT) with palpitations Intermittent SVT episodes, more frequent during pregnancy, self-terminating with Valsalva. Monitoring needed due to pregnancy-related cardiac stress. Discussed potential for ablation and reassured about SVT impact on pregnancy. - Order 14-day heart monitor to assess SVT frequency and characteristics. - Order echocardiogram to evaluate cardiac structure and function. - Advise on Valsalva maneuvers and cold water application for acute SVT episodes. - Instruct to avoid caffeine, artificial sweeteners, and maintain hydration. - Schedule follow-up to discuss monitor and echocardiogram results. - Consider 30-day monitoring or referral to EP specialist if SVT episodes are not captured    Patient Instructions  Medication Instructions:  Your physician recommends that you continue on your  current medications as directed. Please refer to the Current Medication list given to you today.  *If you need a refill on your cardiac medications before your next appointment, please call your pharmacy*  Testing/Procedures: Your physician has requested that you have an echocardiogram-OB. Echocardiography is a painless test that uses sound waves to create images of your heart. It provides  your doctor with information about the size and shape of your heart and how well your heart's chambers and valves are working. This procedure takes approximately one hour. There are no restrictions for this procedure. Please do NOT wear cologne, perfume, aftershave, or lotions (deodorant is allowed). Please arrive 15 minutes prior to your appointment time.  Please note: We ask at that you not bring children with you during ultrasound (echo/ vascular) testing. Due to room size and safety concerns, children are not allowed in the ultrasound rooms during exams. Our front office staff cannot provide observation of children in our lobby area while testing is being conducted. An adult accompanying a patient to their appointment will only be allowed in the ultrasound room at the discretion of the ultrasound technician under special circumstances. We apologize for any inconvenience.  ZIO XT- Long Term Monitor Instructions  Your physician has requested you wear a ZIO patch monitor for 14 days.  This is a single patch monitor. [employer] supplies one patch monitor per enrollment. Additional stickers are not available. Please do not apply patch if you will be having a Nuclear Stress Test,  Echocardiogram, Cardiac CT, MRI, or Chest Xray during the period you would be wearing the  monitor. The patch cannot be worn during these tests. You cannot remove and re-apply the  ZIO XT patch monitor.  Your ZIO patch monitor will be mailed 3 day USPS to your address on file. It may take 3-5 days  to receive your monitor after you have  been enrolled.  Once you have received your monitor, please review the enclosed instructions. Your monitor  has already been registered assigning a specific monitor serial # to you.  Billing and Patient Assistance Program Information  We have supplied [employer] with any of your insurance information on file for billing purposes. [employer] offers a sliding scale Patient Assistance Program for patients that do not have  insurance, or whose insurance does not completely cover the cost of the ZIO monitor.  You must apply for the Patient Assistance Program to qualify for this discounted rate.  To apply, please call [employer] at [phone], select option 4, select option 2, ask to apply for  Patient Assistance Program. Meredeth will ask your household income, and how many people  are in your household. They will quote your out-of-pocket cost based on that information.  [employer] will also be able to set up a 44-month, interest-free payment plan if needed.  Applying the monitor   Shave hair from upper left chest.  Hold abrader disc by orange tab. Rub abrader in 40 strokes over the upper left chest as  indicated in your monitor instructions.  Clean area with 4 enclosed alcohol pads. Let dry.  Apply patch as indicated in monitor instructions. Patch will be placed under collarbone on left  side of chest with arrow pointing upward.  Rub patch adhesive wings for 2 minutes. Remove white label marked 1. Remove the white  label marked 2. Rub patch adhesive wings for 2 additional minutes.  While looking in a mirror, press and release button in center of patch. A small green light will  flash 3-4 times. This will be your only indicator that the monitor has been turned on.  Do not shower for the first 24 hours. You may shower after the first 24 hours.  Press the button if you feel a symptom. You will hear a small click. Record Date, Time and  Symptom in the Patient Logbook.  When you are  ready to remove the  patch, follow instructions on the last 2 pages of Patient  Logbook. Stick patch monitor onto the last page of Patient Logbook.  Place Patient Logbook in the blue and white box. Use locking tab on box and tape box closed  securely. The blue and white box has prepaid postage on it. Please place it in the mailbox as  soon as possible. Your physician should have your test results approximately 7 days after the  monitor has been mailed back to [facility].  Call [facility] Customer Care at [phone] if you have questions regarding  your ZIO XT patch monitor. Call them immediately if you see an orange light blinking on your  monitor.  If your monitor falls off in less than 4 days, contact our Monitor department at [phone].  If your monitor becomes loose or falls off after 4 days call [employer] at [phone] for  suggestions on securing your monitor  Follow-Up: At [facility], you and your health needs are our priority.  As part of our continuing mission to provide you with exceptional heart care, our providers are all part of one team.  This team includes your primary Cardiologist (physician) and Advanced Practice Providers or APPs (Physician Assistants and Nurse Practitioners) who all work together to provide you with the care you need, when you need it.  Your next appointment:   12 week(s) via MyChart  Provider:   Lailany Enoch, DO              Dispo:  No follow-ups on file.   Medication Adjustments/Labs and Tests Ordered: Current medicines are reviewed at length with the patient today.  Concerns regarding medicines are outlined above.  Tests Ordered: Orders Placed This Encounter  Procedures   LONG TERM MONITOR (3-14 DAYS)   EKG 12-Lead   ECHOCARDIOGRAM COMPLETE   Medication Changes: No orders of the defined types were placed in this encounter.

## 2024-02-05 NOTE — Patient Instructions (Addendum)
 Medication Instructions:  Your physician recommends that you continue on your current medications as directed. Please refer to the Current Medication list given to you today.  *If you need a refill on your cardiac medications before your next appointment, please call your pharmacy*  Testing/Procedures: Your physician has requested that you have an echocardiogram-OB. Echocardiography is a painless test that uses sound waves to create images of your heart. It provides your doctor with information about the size and shape of your heart and how well your heart's chambers and valves are working. This procedure takes approximately one hour. There are no restrictions for this procedure. Please do NOT wear cologne, perfume, aftershave, or lotions (deodorant is allowed). Please arrive 15 minutes prior to your appointment time.  Please note: We ask at that you not bring children with you during ultrasound (echo/ vascular) testing. Due to room size and safety concerns, children are not allowed in the ultrasound rooms during exams. Our front office staff cannot provide observation of children in our lobby area while testing is being conducted. An adult accompanying a patient to their appointment will only be allowed in the ultrasound room at the discretion of the ultrasound technician under special circumstances. We apologize for any inconvenience.  ZIO XT- Long Term Monitor Instructions  Your physician has requested you wear a ZIO patch monitor for 14 days.  This is a single patch monitor. [employer] supplies one patch monitor per enrollment. Additional stickers are not available. Please do not apply patch if you will be having a Nuclear Stress Test,  Echocardiogram, Cardiac CT, MRI, or Chest Xray during the period you would be wearing the  monitor. The patch cannot be worn during these tests. You cannot remove and re-apply the  ZIO XT patch monitor.  Your ZIO patch monitor will be mailed 3 day USPS to your  address on file. It may take 3-5 days  to receive your monitor after you have been enrolled.  Once you have received your monitor, please review the enclosed instructions. Your monitor  has already been registered assigning a specific monitor serial # to you.  Billing and Patient Assistance Program Information  We have supplied [employer] with any of your insurance information on file for billing purposes. [employer] offers a sliding scale Patient Assistance Program for patients that do not have  insurance, or whose insurance does not completely cover the cost of the ZIO monitor.  You must apply for the Patient Assistance Program to qualify for this discounted rate.  To apply, please call [employer] at [phone], select option 4, select option 2, ask to apply for  Patient Assistance Program. Meredeth will ask your household income, and how many people  are in your household. They will quote your out-of-pocket cost based on that information.  [employer] will also be able to set up a 23-month, interest-free payment plan if needed.  Applying the monitor   Shave hair from upper left chest.  Hold abrader disc by orange tab. Rub abrader in 40 strokes over the upper left chest as  indicated in your monitor instructions.  Clean area with 4 enclosed alcohol pads. Let dry.  Apply patch as indicated in monitor instructions. Patch will be placed under collarbone on left  side of chest with arrow pointing upward.  Rub patch adhesive wings for 2 minutes. Remove white label marked 1. Remove the white  label marked 2. Rub patch adhesive wings for 2 additional minutes.  While looking in a mirror, press and release button  in center of patch. A small green light will  flash 3-4 times. This will be your only indicator that the monitor has been turned on.  Do not shower for the first 24 hours. You may shower after the first 24 hours.  Press the button if you feel a symptom. You will hear a small click. Record  Date, Time and  Symptom in the Patient Logbook.  When you are ready to remove the patch, follow instructions on the last 2 pages of Patient  Logbook. Stick patch monitor onto the last page of Patient Logbook.  Place Patient Logbook in the blue and white box. Use locking tab on box and tape box closed  securely. The blue and white box has prepaid postage on it. Please place it in the mailbox as  soon as possible. Your physician should have your test results approximately 7 days after the  monitor has been mailed back to [facility].  Call [facility] Customer Care at [phone] if you have questions regarding  your ZIO XT patch monitor. Call them immediately if you see an orange light blinking on your  monitor.  If your monitor falls off in less than 4 days, contact our Monitor department at [phone].  If your monitor becomes loose or falls off after 4 days call [employer] at [phone] for  suggestions on securing your monitor  Follow-Up: At [facility], you and your health needs are our priority.  As part of our continuing mission to provide you with exceptional heart care, our providers are all part of one team.  This team includes your primary Cardiologist (physician) and Advanced Practice Providers or APPs (Physician Assistants and Nurse Practitioners) who all work together to provide you with the care you need, when you need it.  Your next appointment:   12 week(s) via MyChart  Provider:   Kardie Tobb, DO

## 2024-02-08 ENCOUNTER — Ambulatory Visit: Attending: Cardiology

## 2024-02-08 DIAGNOSIS — R002 Palpitations: Secondary | ICD-10-CM | POA: Diagnosis not present

## 2024-02-08 NOTE — Progress Notes (Unsigned)
 Enrolled for [employer] to mail a ZIO XT long term holter monitor to the patients address on file.

## 2024-03-01 ENCOUNTER — Ambulatory Visit (HOSPITAL_BASED_OUTPATIENT_CLINIC_OR_DEPARTMENT_OTHER): Admitting: Obstetrics

## 2024-03-01 ENCOUNTER — Other Ambulatory Visit: Payer: Self-pay | Admitting: *Deleted

## 2024-03-01 ENCOUNTER — Ambulatory Visit: Attending: Obstetrics and Gynecology

## 2024-03-01 VITALS — BP 117/66

## 2024-03-01 DIAGNOSIS — O09812 Supervision of pregnancy resulting from assisted reproductive technology, second trimester: Secondary | ICD-10-CM | POA: Diagnosis present

## 2024-03-01 DIAGNOSIS — O358XX0 Maternal care for other (suspected) fetal abnormality and damage, not applicable or unspecified: Secondary | ICD-10-CM

## 2024-03-01 DIAGNOSIS — O09522 Supervision of elderly multigravida, second trimester: Secondary | ICD-10-CM | POA: Insufficient documentation

## 2024-03-01 DIAGNOSIS — O26892 Other specified pregnancy related conditions, second trimester: Secondary | ICD-10-CM | POA: Diagnosis not present

## 2024-03-01 DIAGNOSIS — Z3A25 25 weeks gestation of pregnancy: Secondary | ICD-10-CM | POA: Diagnosis not present

## 2024-03-01 DIAGNOSIS — R002 Palpitations: Secondary | ICD-10-CM

## 2024-03-01 NOTE — Progress Notes (Signed)
 MFM Consult Note  Tiffany Wolf is currently at 25 weeks and 3 days.  She has been followed due to advanced maternal age (38 years old) and IVF pregnancy.  She denies any problems since her last exam.  Due to the IVF pregnancy, she had a fetal echocardiogram performed with [facility] pediatric cardiology that showed a small muscular VSD.  The patient was advised by the pediatric cardiologist that it is possible that the defect will close spontaneously either prenatally or after birth.  An echocardiogram is recommended for the baby after birth.  Sonographic findings Single intrauterine pregnancy at 25w 3d.  Fetal cardiac activity:  Observed and appears normal. Presentation: Breech. Fetal biometry shows the estimated fetal weight of 1 pound 14 ounces which measures at the 54th percentile. Amniotic fluid volume: Within normal limits. MVP: 5.84 cm. Placenta: Anterior.  There are limitations of prenatal ultrasound such as the inability to detect certain abnormalities due to poor visualization. Various factors such as fetal position, gestational age and maternal body habitus may increase the difficulty in visualizing the fetal anatomy.    Advanced maternal age and IVF pregnancy We will continue to follow her with growth ultrasounds throughout her pregnancy. Weekly fetal testing should be started at 34 weeks. Delivery should occur at around 39 weeks. Her baby will require an echocardiogram after birth to evaluate the VSD.  She will return in 5 weeks for another ultrasound exam.    The patient stated that all of her questions were answered today.  A total of 20 minutes was spent counseling and coordinating the care for this patient.  Greater than 50% of the time was spent in direct face-to-face contact.

## 2024-03-14 ENCOUNTER — Ambulatory Visit: Payer: Self-pay | Admitting: Cardiology

## 2024-03-21 ENCOUNTER — Ambulatory Visit (HOSPITAL_COMMUNITY)
Admission: RE | Admit: 2024-03-21 | Discharge: 2024-03-21 | Disposition: A | Discharge status: Home / Self Care | Source: Ambulatory Visit | Attending: Cardiology | Admitting: Cardiology

## 2024-03-21 DIAGNOSIS — I471 Supraventricular tachycardia, unspecified: Secondary | ICD-10-CM | POA: Diagnosis not present

## 2024-03-21 LAB — ECHOCARDIOGRAM COMPLETE
Area-P 1/2: 4.19 cm2
S' Lateral: 3.6 cm

## 2024-04-05 ENCOUNTER — Ambulatory Visit

## 2024-04-05 ENCOUNTER — Ambulatory Visit: Attending: Obstetrics

## 2024-04-08 NOTE — Telephone Encounter (Signed)
 Results/message from Dr. Emmette Harms has been released to MyChart. A letter is being sent to the last known home address.

## 2024-04-09 ENCOUNTER — Inpatient Hospital Stay (HOSPITAL_COMMUNITY)
Admission: AD | Admit: 2024-04-09 | Discharge: 2024-04-09 | Disposition: A | Discharge status: Home / Self Care | Attending: Obstetrics and Gynecology | Admitting: Obstetrics and Gynecology

## 2024-04-09 ENCOUNTER — Encounter (HOSPITAL_COMMUNITY): Payer: Self-pay | Admitting: Obstetrics

## 2024-04-09 ENCOUNTER — Other Ambulatory Visit: Payer: Self-pay

## 2024-04-09 DIAGNOSIS — O26893 Other specified pregnancy related conditions, third trimester: Secondary | ICD-10-CM | POA: Diagnosis not present

## 2024-04-09 DIAGNOSIS — Z3A31 31 weeks gestation of pregnancy: Secondary | ICD-10-CM | POA: Diagnosis not present

## 2024-04-09 DIAGNOSIS — R002 Palpitations: Secondary | ICD-10-CM

## 2024-04-09 DIAGNOSIS — Z3493 Encounter for supervision of normal pregnancy, unspecified, third trimester: Secondary | ICD-10-CM

## 2024-04-09 NOTE — MAU Provider Note (Signed)
 History     CSN: [id number]  Arrival date and time: 04/09/24 1216   Event Date/Time   First Provider Initiated Contact with Patient 04/09/24 1322      Chief Complaint  Patient presents with   Palpitations   Decreased Fetal Movement   Tiffany Wolf , a  38 y.o. G2P0010 at [redacted]w[redacted]d presents to MAU with complaints of heart palpitations and decreased fetal movement. Patient states that she was standing and started having palpitation. Reports her heart rate getting up to 160-170 for about 20 minutes. Reports previous hx of palpitations. Denies light headedness, dizziness, or passing out. Afterwards she reports not feeling baby move. She states that she hasn't felt movement in over 1 hour however since being in MAU reports some movement. She denies vaginal bleeding leaking of fluid and contractions. Patient reports that she was just worried.          OB History     Gravida  2   Para      Term      Preterm      AB  1   Living         SAB  1   IAB      Ectopic      Multiple      Live Births              Past Medical History:  Diagnosis Date   Chest pain    Family history of breast cancer    Hyperthyroidism    Palpitations    SVT (supraventricular tachycardia)     Past Surgical History:  Procedure Laterality Date   NO PAST SURGERIES     WISDOM TOOTH EXTRACTION      Family History  Problem Relation Age of Onset   Heart attack Mother    Heart disease Mother    Heart disease Father    Heart attack Father    Breast cancer Sister 39   Cancer Maternal Aunt    Breast cancer Maternal Aunt    Asthma Neg Hx    Diabetes Neg Hx    Hypertension Neg Hx     Social History   Tobacco Use   Smoking status: Former    Types: E-cigarettes   Smokeless tobacco: Never  Vaping Use   Vaping status: Former   Substances: Nicotine  Substance Use Topics   Alcohol use: Not Currently    Comment: Seldom   Drug use: Never    Allergies:  Allergies  Allergen  Reactions   Penicillins     Medications Prior to Admission  Medication Sig Dispense Refill Last Dose/Taking   aspirin EC 81 MG tablet Take 81 mg by mouth daily. Swallow whole.   04/09/2024   loratadine (CLARITIN) 10 MG tablet Take 10 mg by mouth daily.   04/09/2024   Prenatal Vit-Fe Fumarate-FA (PRENATAL MULTIVITAMIN) TABS tablet Take 1 tablet by mouth daily at 12 noon.   04/09/2024   albuterol  (VENTOLIN  HFA) 108 (90 Base) MCG/ACT inhaler Inhale 1-2 puffs into the lungs every 6 (six) hours as needed for wheezing or shortness of breath. 8 g 0    fluticasone (FLONASE) 50 MCG/ACT nasal spray Place 2 sprays into both nostrils daily as needed for allergies or rhinitis. (Patient not taking: Reported on 03/01/2024)      ibuprofen (ADVIL) 200 MG tablet Take 600-800 mg by mouth 2 (two) times daily as needed for headache or moderate pain. (Patient not taking: Reported on 02/05/2024)  metoprolol  tartrate (LOPRESSOR ) 25 MG tablet Take 1 tablet (25 mg total) by mouth 2 (two) times daily as needed (palpitations). (Patient not taking: Reported on 03/01/2024) 180 tablet 3    predniSONE  (DELTASONE ) 20 MG tablet Take 2 tablets (40 mg total) by mouth daily. (Patient not taking: Reported on 03/01/2024) 10 tablet 0     Review of Systems  Constitutional:  Negative for chills, fatigue and fever.  Eyes:  Negative for pain and visual disturbance.  Respiratory:  Negative for apnea, shortness of breath and wheezing.   Cardiovascular:  Positive for palpitations. Negative for chest pain.  Gastrointestinal:  Negative for abdominal pain, constipation, diarrhea, nausea and vomiting.  Genitourinary:  Negative for difficulty urinating, dysuria, pelvic pain, vaginal bleeding, vaginal discharge and vaginal pain.  Musculoskeletal:  Negative for back pain.  Neurological:  Negative for dizziness, seizures, facial asymmetry, weakness, light-headedness, numbness and headaches.  Psychiatric/Behavioral:  Negative for suicidal ideas.     Physical Exam   Blood pressure 113/71, pulse 95, temperature 97.9 F (36.6 C), temperature source Oral, resp. rate 17, height 5' 5 (1.651 m), weight 77.2 kg, SpO2 97%, unknown if currently breastfeeding.  Physical Exam Vitals and nursing note reviewed.  Constitutional:      General: She is not in acute distress.    Appearance: Normal appearance.  HENT:     Head: Normocephalic.  Cardiovascular:     Rate and Rhythm: Normal rate and regular rhythm.  Pulmonary:     Effort: Pulmonary effort is normal.     Breath sounds: Normal breath sounds.  Abdominal:     Palpations: Abdomen is soft.     Tenderness: There is no abdominal tenderness.     Comments: Gravid uterus. Fetal movement present on palpation   Musculoskeletal:     Cervical back: Normal range of motion.  Skin:    General: Skin is warm and dry.  Neurological:     Mental Status: She is alert and oriented to person, place, and time.  Psychiatric:        Mood and Affect: Mood normal.    FHT: 135 bpm with moderate variability 15x15 accels present no decels  Toco: Quiet  MAU Course  Procedures Orders Placed This Encounter  Procedures   EKG 12-Lead  Patient provided a fetal kick count clicker.   MDM - EKG was NSR  - VS stable and patient asymptomatic. CNM reviewed Echo from 11/17 and was noted to be normal.  - Patient already has F/u with Dr. Davied of [facility] cardiology.  - FHT appropriate for gestational age.  - Patient hit fetal clicker >20 times while in MAU.  - plan for discharge.   Assessment and Plan   1. Palpitations   2. [redacted] weeks gestation of pregnancy   3. Movement of fetus present during pregnancy in third trimester    - Reviewed fetal movement expectations for 3rd trimester of pregnancy.  - Recommended to follow up with Cardiology for Palpitation  - FHT appropriate for gestational age at time of discharge.  - Reviewed worsening signs and return precautions.  - patient discharged home in stable condition  and may return to MAU as needed.   Tiffany CHRISTELLA Cedar, MSN CNM  04/09/2024, 1:22 PM

## 2024-04-09 NOTE — MAU Note (Signed)
 Tiffany Wolf is a 38 y.o. at [redacted]w[redacted]d here in MAU reporting: she was having palpations that lasted around 30 minutes. States she has palpitations but it doesn't normally last that long. HR was 160-170. Denies any LOF or VB. Hasn't felt fetal movement since 1 hour ago. Here to check on the baby.   Onset of complaint: 1 hour ago Pain score: 0 Vitals:   04/09/24 1236  BP: 113/71  Pulse: 95  Resp: 17  Temp: 97.9 F (36.6 C)  SpO2: 97%     FHT:138 Lab orders placed from triage:

## 2024-04-22 ENCOUNTER — Telehealth: Payer: Self-pay

## 2024-04-22 NOTE — Telephone Encounter (Signed)
 Late entry: Called pt to set up an appointment with Dr. Sheena for 12/23. No answer, left message for her to return the call.

## 2024-04-22 NOTE — Telephone Encounter (Signed)
 Called pt to set up an appointment with Dr. Sheena for 12/23. No answer, left message for her to return the call.

## 2024-04-26 ENCOUNTER — Telehealth: Payer: Self-pay

## 2024-04-26 ENCOUNTER — Ambulatory Visit: Attending: Cardiology | Admitting: Cardiology

## 2024-04-26 DIAGNOSIS — Z3A33 33 weeks gestation of pregnancy: Secondary | ICD-10-CM

## 2024-04-26 DIAGNOSIS — Z331 Pregnant state, incidental: Secondary | ICD-10-CM | POA: Diagnosis not present

## 2024-04-26 DIAGNOSIS — I471 Supraventricular tachycardia, unspecified: Secondary | ICD-10-CM | POA: Diagnosis not present

## 2024-04-26 MED ORDER — METOPROLOL SUCCINATE ER 25 MG PO TB24: 12.5000 mg | ORAL_TABLET | Freq: Every evening | ORAL | 3 refills | Status: DC

## 2024-04-26 NOTE — Patient Instructions (Signed)
 Medication Instructions:  Your physician has recommended you make the following change in your medication:  START: Toprol -XL 12.5 mg nightly  *If you need a refill on your cardiac medications before your next appointment, please call your pharmacy*   Follow-Up: At [facility], you and your health needs are our priority.  As part of our continuing mission to provide you with exceptional heart care, our providers are all part of one team.  This team includes your primary Cardiologist (physician) and Advanced Practice Providers or APPs (Physician Assistants and Nurse Practitioners) who all work together to provide you with the care you need, when you need it.  Your next appointment:   12 week(s)  Provider:   Kardie Tobb, DO

## 2024-04-26 NOTE — Telephone Encounter (Signed)
 Left patient a message to get her checked in for her upcoming video visit. 1st attempt.

## 2024-04-26 NOTE — Progress Notes (Signed)
 "   Virtual Visit via Video Note   Because of Tiffany Wolf's co-morbid illnesses, she is at least at moderate risk for complications without adequate follow up.  This format is felt to be most appropriate for this patient at this time.  All issues noted in this document were discussed and addressed.  A limited physical exam was performed with this format.  Please refer to the patient's chart for her consent to telehealth for [facility].  Video Connection Lost Video connection was lost at < 50% of the duration of this visit, at which time the remainder of the visit was completed via audio only.     Virtual Visit via telephone  Note . I connected with the patient today by a telephone enabled telemedicine application and verified that I am speaking with the correct person using two identifiers.   Date:  04/26/2024   ID:  Tiffany Wolf, DOB 19-May-1985, MRN [id number]  Patient Location: Home Provider Location: Office/Clinic  PCP:  Patient, No Pcp Per  Cardiologist:  Dub Huntsman, DO  Electrophysiologist:  None   Evaluation Performed:  Follow-Up Visit  Chief Complaint:   I am doing well  History of Present Illness:    Tiffany Wolf is a 38 y.o. female with paroxysmal SVT, palpitations and hyperthyroidism.  She is currently [redacted] weeks pregnant.  Saw the patient prior at that time she was experiencing palpitations known SVT placed a monitor on the patient. Since I saw her she tells me that she has been experiencing some palpitations recent noted heart rate in the 170s.  The patient does not have symptoms concerning for COVID-19 infection (fever, chills, cough, or new shortness of breath).    Past Medical History:  Diagnosis Date   Chest pain    Family history of breast cancer    Hyperthyroidism    Palpitations    SVT (supraventricular tachycardia)    Past Surgical History:  Procedure Laterality Date   NO PAST SURGERIES     WISDOM TOOTH EXTRACTION       Active  Medications[1]   Allergies:   Penicillins   Social History[2]   Family Hx: The patient's family history includes Breast cancer in her maternal aunt; Breast cancer (age of onset: 58) in her sister; Cancer in her maternal aunt; Heart attack in her father and mother; Heart disease in her father and mother. There is no history of Asthma, Diabetes, or Hypertension.  ROS:   Please see the history of present illness.     All other systems reviewed and are negative.   Prior CV studies:   The following studies were reviewed today:  ZIO monitor echocardiogram  Labs/Other Tests and Data Reviewed:    EKG:  No ECG reviewed.  Recent Labs: No results found for requested labs within last 365 days.   Recent Lipid Panel No results found for: CHOL, TRIG, HDL, CHOLHDL, LDLCALC, LDLDIRECT  Wt Readings from Last 3 Encounters:  04/09/24 170 lb 3.2 oz (77.2 kg)  02/05/24 148 lb 6.4 oz (67.3 kg)  05/26/23 132 lb 3.2 oz (60 kg)     Objective:    Vital Signs:  There were no vitals taken for this visit.     ASSESSMENT & PLAN:    Paroxysmal SVT-ZIO monitor reviewed with the patient 21 episodes suspected atrial tachycardia.  She has had increased heart rate since I last seen her up to the 170s.  It will be beneficial to start her on low-dose  beta-blocker Toprol  XL 12.5 mg at nighttime.  After further discussion I was able to answer all of her questions about the medications and the reason for use she is agreeable to try this medication. Total time on the phone 12 minutes. She will follow-up in 12 weeks  COVID-19 Education: The signs and symptoms of COVID-19 were discussed with the patient and how to seek care for testing (follow up with PCP or arrange E-visit).  The importance of social distancing was discussed today.  Time:   Today, I have spent 12 minutes with the patient with telehealth technology discussing the above problems.     Medication Adjustments/Labs and Tests  Ordered: Current medicines are reviewed at length with the patient today.  Concerns regarding medicines are outlined above.   Tests Ordered: No orders of the defined types were placed in this encounter.   Medication Changes: No orders of the defined types were placed in this encounter.   Follow Up:  In Person in 12 week(s)  Signed, Ulanda Tackett, DO  04/26/2024 11:17 AM    [facility]      [1]  Current Meds  Medication Sig   aspirin EC 81 MG tablet Take 81 mg by mouth daily. Swallow whole.   loratadine (CLARITIN) 10 MG tablet Take 10 mg by mouth daily.   Prenatal Vit-Fe Fumarate-FA (PRENATAL MULTIVITAMIN) TABS tablet Take 1 tablet by mouth daily at 12 noon.  [2]  Social History Tobacco Use   Smoking status: Former    Types: E-cigarettes   Smokeless tobacco: Never  Vaping Use   Vaping status: Former   Substances: Nicotine  Substance Use Topics   Alcohol use: Not Currently    Comment: Seldom   Drug use: Never   "

## 2024-05-03 ENCOUNTER — Other Ambulatory Visit

## 2024-05-03 ENCOUNTER — Ambulatory Visit

## 2024-05-10 ENCOUNTER — Other Ambulatory Visit

## 2024-05-10 ENCOUNTER — Ambulatory Visit

## 2024-05-17 ENCOUNTER — Ambulatory Visit: Attending: Obstetrics

## 2024-05-17 ENCOUNTER — Other Ambulatory Visit

## 2024-05-18 LAB — OB RESULTS CONSOLE GBS: GBS: NEGATIVE

## 2024-06-02 ENCOUNTER — Telehealth (HOSPITAL_COMMUNITY): Payer: Self-pay | Admitting: *Deleted

## 2024-06-02 ENCOUNTER — Encounter (HOSPITAL_COMMUNITY): Payer: Self-pay | Admitting: Student

## 2024-06-02 ENCOUNTER — Other Ambulatory Visit: Payer: Self-pay

## 2024-06-02 ENCOUNTER — Inpatient Hospital Stay (HOSPITAL_COMMUNITY): Admission: RE | Admit: 2024-06-02 | Source: Home / Self Care | Admitting: Obstetrics and Gynecology

## 2024-06-02 ENCOUNTER — Encounter (HOSPITAL_COMMUNITY): Payer: Self-pay

## 2024-06-02 ENCOUNTER — Inpatient Hospital Stay (HOSPITAL_COMMUNITY): Admission: AD | Admit: 2024-06-02 | Discharge: 2024-06-05 | DRG: 786 | Disposition: A

## 2024-06-02 DIAGNOSIS — Z87891 Personal history of nicotine dependence: Secondary | ICD-10-CM

## 2024-06-02 DIAGNOSIS — O4292 Full-term premature rupture of membranes, unspecified as to length of time between rupture and onset of labor: Principal | ICD-10-CM | POA: Diagnosis present

## 2024-06-02 DIAGNOSIS — O26893 Other specified pregnancy related conditions, third trimester: Secondary | ICD-10-CM | POA: Diagnosis present

## 2024-06-02 DIAGNOSIS — Z6791 Unspecified blood type, Rh negative: Secondary | ICD-10-CM

## 2024-06-02 DIAGNOSIS — O358XX0 Maternal care for other (suspected) fetal abnormality and damage, not applicable or unspecified: Secondary | ICD-10-CM | POA: Diagnosis present

## 2024-06-02 DIAGNOSIS — Z3493 Encounter for supervision of normal pregnancy, unspecified, third trimester: Principal | ICD-10-CM

## 2024-06-02 DIAGNOSIS — O9081 Anemia of the puerperium: Secondary | ICD-10-CM | POA: Diagnosis not present

## 2024-06-02 DIAGNOSIS — Z3A38 38 weeks gestation of pregnancy: Secondary | ICD-10-CM

## 2024-06-02 DIAGNOSIS — O411230 Chorioamnionitis, third trimester, not applicable or unspecified: Secondary | ICD-10-CM | POA: Diagnosis present

## 2024-06-02 DIAGNOSIS — D62 Acute posthemorrhagic anemia: Secondary | ICD-10-CM | POA: Diagnosis not present

## 2024-06-02 DIAGNOSIS — Z8249 Family history of ischemic heart disease and other diseases of the circulatory system: Secondary | ICD-10-CM

## 2024-06-02 LAB — TYPE AND SCREEN
ABO/RH(D): O NEG
Antibody Screen: NEGATIVE

## 2024-06-02 LAB — CBC
HCT: 34.6 % — ABNORMAL LOW (ref 36.0–46.0)
Hemoglobin: 12.3 g/dL (ref 12.0–15.0)
MCH: 33.8 pg (ref 26.0–34.0)
MCHC: 35.5 g/dL (ref 30.0–36.0)
MCV: 95.1 fL (ref 80.0–100.0)
Platelets: 209 10*3/uL (ref 150–400)
RBC: 3.64 MIL/uL — ABNORMAL LOW (ref 3.87–5.11)
RDW: 13 % (ref 11.5–15.5)
WBC: 8.1 10*3/uL (ref 4.0–10.5)
nRBC: 0 % (ref 0.0–0.2)

## 2024-06-02 LAB — SYPHILIS: RPR W/REFLEX TO RPR TITER AND TREPONEMAL ANTIBODIES, TRADITIONAL SCREENING AND DIAGNOSIS ALGORITHM: RPR Ser Ql: NONREACTIVE

## 2024-06-02 LAB — POCT FERN TEST: POCT Fern Test: POSITIVE

## 2024-06-02 MED ORDER — ONDANSETRON HCL 4 MG/2ML IJ SOLN
4.0000 mg | Freq: Four times a day (QID) | INTRAMUSCULAR | Status: DC | PRN
  Administered 2024-06-02 – 2024-06-03 (×2): 4 mg via INTRAVENOUS
  Filled 2024-06-02 (×2): qty 2

## 2024-06-02 MED ORDER — OXYTOCIN-SODIUM CHLORIDE 30-0.9 UT/500ML-% IV SOLN: 2.5000 [IU]/h | INTRAVENOUS | Status: DC

## 2024-06-02 MED ORDER — SOD CITRATE-CITRIC ACID 500-334 MG/5ML PO SOLN
30.0000 mL | ORAL | Status: DC | PRN
  Administered 2024-06-03: 30 mL via ORAL
  Filled 2024-06-02: qty 30

## 2024-06-02 MED ORDER — TERBUTALINE SULFATE 1 MG/ML IJ SOLN: 0.2500 mg | Freq: Once | INTRAMUSCULAR | Status: DC | PRN

## 2024-06-02 MED ORDER — LACTATED RINGERS IV SOLN
500.0000 mL | INTRAVENOUS | Status: AC | PRN
  Administered 2024-06-02: 500 mL via INTRAVENOUS

## 2024-06-02 MED ORDER — OXYTOCIN-SODIUM CHLORIDE 30-0.9 UT/500ML-% IV SOLN
1.0000 m[IU]/min | INTRAVENOUS | Status: DC
  Administered 2024-06-02: 2 m[IU]/min via INTRAVENOUS
  Filled 2024-06-02: qty 500

## 2024-06-02 MED ORDER — OXYCODONE-ACETAMINOPHEN 5-325 MG PO TABS: 2.0000 | ORAL_TABLET | ORAL | Status: DC | PRN

## 2024-06-02 MED ORDER — MISOPROSTOL 25 MCG QUARTER TABLET: 25.0000 ug | ORAL_TABLET | ORAL | Status: DC | PRN

## 2024-06-02 MED ORDER — LACTATED RINGERS IV SOLN: INTRAVENOUS | Status: DC

## 2024-06-02 MED ORDER — LIDOCAINE HCL (PF) 1 % IJ SOLN: 30.0000 mL | INTRAMUSCULAR | Status: DC | PRN

## 2024-06-02 MED ORDER — OXYTOCIN BOLUS FROM INFUSION: 333.0000 mL | Freq: Once | INTRAVENOUS | Status: DC

## 2024-06-02 MED ORDER — OXYCODONE-ACETAMINOPHEN 5-325 MG PO TABS: 1.0000 | ORAL_TABLET | ORAL | Status: DC | PRN

## 2024-06-02 MED ORDER — ACETAMINOPHEN 325 MG PO TABS
650.0000 mg | ORAL_TABLET | ORAL | Status: DC | PRN
  Filled 2024-06-02: qty 2

## 2024-06-02 MED ORDER — MISOPROSTOL 50MCG HALF TABLET
50.0000 ug | ORAL_TABLET | ORAL | Status: DC | PRN
  Administered 2024-06-02 (×2): 50 ug via ORAL
  Filled 2024-06-02 (×2): qty 1

## 2024-06-02 NOTE — H&P (Signed)
 Tiffany Wolf is a 39 y.o. female G2P0010 at [redacted]w[redacted]d presenting for PROM on 06/02/24 at 05:30.  Pregnancy complicated by: IVF pregnancy -  single FET of PGT-A tested embryo on 05/22, had US  on 06/18 was [redacted]w[redacted]d and given EDD: 06/11/2024 AMA - LR NIPT, female fetus  Fetal cardiac abnormality -  10/9 fetal ECHO with small VSD. Routine postnatal care for baby other than postnatal ECHO in newborn nursery or at [facility] ped cardio in first weeks of life. History of hyperthyroidism - h/o borderline hyperthyroidism: had to wear heart monitor and take meds as a teenager for a year. Asymptomatic aside from random palpitation. Normal TSH at new OB visit. Rh negative - s/p Rhogam   OB History     Gravida  2   Para      Term      Preterm      AB  1   Living         SAB  1   IAB      Ectopic      Multiple      Live Births             Past Medical History:  Diagnosis Date   Chest pain    Family history of breast cancer    Hyperthyroidism    Palpitations    SVT (supraventricular tachycardia)    Past Surgical History:  Procedure Laterality Date   NO PAST SURGERIES     WISDOM TOOTH EXTRACTION     Family History: family history includes Breast cancer in her maternal aunt; Breast cancer (age of onset: 38) in her sister; Cancer in her maternal aunt; Heart attack in her father and mother; Heart disease in her father and mother. Social History:  reports that she has quit smoking. Her smoking use included e-cigarettes. She has never used smokeless tobacco. She reports that she does not currently use alcohol. She reports that she does not use drugs.     Maternal Diabetes: No Genetic Screening: Normal Maternal Ultrasounds/Referrals: Fetal Heart Anomalies Fetal Ultrasounds or other Referrals:  Fetal echo, Referred to Materal Fetal Medicine  Maternal Substance Abuse:  No Significant Maternal Medications:  None Significant Maternal Lab Results:  Group B Strep negative and Rh negative Number  of Prenatal Visits:greater than 3 verified prenatal visits Maternal Vaccinations:TDap and Flu Other Comments:    Review of Systems  Constitutional:  Negative for chills and fever.  Cardiovascular:  Negative for chest pain and palpitations.  Gastrointestinal:  Negative for diarrhea, nausea and vomiting.  Genitourinary:  Negative for dysuria and vaginal bleeding.  Skin:  Negative for rash.  Neurological:  Negative for light-headedness and headaches.  Psychiatric/Behavioral:  The patient is not nervous/anxious.       Blood pressure 123/67, pulse 74, temperature 97.7 F (36.5 C), temperature source Oral, resp. rate 16, height 5' 5 (1.651 m), weight 79 kg, SpO2 99%, unknown if currently breastfeeding. Exam Physical Exam Constitutional:      Appearance: Normal appearance.  HENT:     Head: Normocephalic.     Mouth/Throat:     Mouth: Mucous membranes are moist.  Eyes:     Pupils: Pupils are equal, round, and reactive to light.  Cardiovascular:     Rate and Rhythm: Normal rate.  Pulmonary:     Effort: Pulmonary effort is normal.  Abdominal:     Comments: Gravid, non-tender  Musculoskeletal:        General: Normal range of motion.  Cervical back: Normal range of motion.  Skin:    General: Skin is warm.  Neurological:     General: No focal deficit present.     Mental Status: She is alert and oriented to person, place, and time.  Psychiatric:        Mood and Affect: Mood normal.        Behavior: Behavior normal.     Prenatal labs: ABO, Rh:  O negative Antibody:  negative Rubella:  immune RPR:   non-reactive HBsAg:   negative HIV:   non-reactive GBS:   negative  Assessment/Plan: 39 y.o. G2P0010 at [redacted]w[redacted]d admitted for PROM  Labor:  oral Cytotec  ordered. Recheck in 4hr, foley bulb PRN Pain control: IV pain medication until in active labor or epidural PRN GBS negative  Female fetus Helen) - desire circumcision prior to discharge, fetus needs post-natal echo due to  VSD  Tiffany Wolf 06/02/2024, 8:16 AM

## 2024-06-02 NOTE — MAU Note (Signed)
 Tiffany Wolf is a 39 y.o. at [redacted]w[redacted]d here in MAU reporting: SROM at 0530 clear fluid, pt wearing a pad in reports liquid still coming out. She reports +FMs, Denies VB, regular contractions, blurry vision, headaches, peripheral edema, or RUQ pain.   LMP: -- Onset of complaint: 0530 Pain score: 0/10 There were no vitals filed for this visit.   FHT: 160  Lab orders placed from triage: mau triage  Pt reports neg GBS

## 2024-06-02 NOTE — Telephone Encounter (Signed)
 Preadmission screen

## 2024-06-02 NOTE — Progress Notes (Signed)
 Tiffany Wolf is a 39 y.o. G2P0010 at [redacted]w[redacted]d admitted for PROM  Subjective: Notes intermittent contraction. Continued LOF, with slight blood tinge. + FM  Objective: BP 105/63   Pulse 74   Temp 97.7 F (36.5 C) (Oral)   Resp 16   Ht 5' 5 (1.651 m)   Wt 79 kg   SpO2 99%   BMI 28.97 kg/m  No intake/output data recorded. No intake/output data recorded.  FHT: FHR 135, moderate variability, accelerations +, no deceleration. Toco: irregular Cat 1   SVE:   Dilation: Fingertip Effacement (%): 30 Station: -3 Exam by:: Dr. Lane  Labs: Lab Results  Component Value Date   WBC 8.1 06/02/2024   HGB 12.3 06/02/2024   HCT 34.6 (L) 06/02/2024   MCV 95.1 06/02/2024   PLT 209 06/02/2024    Assessment / Plan: 39 yo G2P0010 at [redacted]w[redacted]d under-going induction of labor due to PROM  Labor: s/p cytotec  x 1, foley bulb placed and second 50 mcg PO miso given at 1720 Fetal Wellbeing:  Category I Pain Control:  IV meds until 6 cm or epidural PRN   Charmaine CHRISTELLA Lane, MD 06/02/2024, 5:28 PM

## 2024-06-02 NOTE — Progress Notes (Signed)
 Celsey VENERA PRIVOTT is a 39 y.o. G2P0010 at [redacted]w[redacted]d admitted for PROM  Subjective: Denies pain with contractions. Continued LOF. Denies VB. + FM  Objective: BP 123/75   Pulse 76   Temp 98 F (36.7 C) (Oral)   Resp 16   Ht 5' 5 (1.651 m)   Wt 79 kg   SpO2 99%   BMI 28.97 kg/m  No intake/output data recorded. No intake/output data recorded.  FHT: FHR 140, moderate variability, accelerations +, no deceleration. Toco: irregular Cat 1   SVE:   Dilation: Fingertip Effacement (%): 30 Station: -3 Exam by:: Dr. Lane  Labs: Lab Results  Component Value Date   WBC 8.1 06/02/2024   HGB 12.3 06/02/2024   HCT 34.6 (L) 06/02/2024   MCV 95.1 06/02/2024   PLT 209 06/02/2024    Assessment / Plan: 39 yo G2P0010 at [redacted]w[redacted]d under-going induction of labor due to PROM  MD to bedsidefor prolonged deceleration. FHT noted to have cut out and when returned FHR in 90s - returned to baseline with position changes.   Labor: s/p PO 50 mcg cytotec  x 2, foley bulb still in place (17:20) re-positioned to mid cervix, pitocin  ordered (max 10 milli units while foley bulb in place) Fetal Wellbeing:  Category I Pain Control:  IV meds until 6 cm or epidural PRN   Charmaine CHRISTELLA Lane, MD 06/02/2024, 10:28 PM

## 2024-06-03 ENCOUNTER — Encounter (HOSPITAL_COMMUNITY): Payer: Self-pay | Admitting: Obstetrics and Gynecology

## 2024-06-03 ENCOUNTER — Inpatient Hospital Stay (HOSPITAL_COMMUNITY): Admitting: Anesthesiology

## 2024-06-03 ENCOUNTER — Encounter (HOSPITAL_COMMUNITY): Admission: AD | Disposition: A | Payer: Self-pay | Source: Home / Self Care

## 2024-06-03 ENCOUNTER — Telehealth (HOSPITAL_COMMUNITY): Payer: Self-pay | Admitting: *Deleted

## 2024-06-03 DIAGNOSIS — O411230 Chorioamnionitis, third trimester, not applicable or unspecified: Secondary | ICD-10-CM

## 2024-06-03 DIAGNOSIS — Z3A38 38 weeks gestation of pregnancy: Secondary | ICD-10-CM | POA: Diagnosis not present

## 2024-06-03 LAB — CBC WITH DIFFERENTIAL/PLATELET

## 2024-06-03 MED ORDER — SIMETHICONE 80 MG PO CHEW: 80.0000 mg | CHEWABLE_TABLET | ORAL | Status: DC | PRN

## 2024-06-03 MED ORDER — SODIUM CHLORIDE 0.9% FLUSH: 3.0000 mL | INTRAVENOUS | Status: DC | PRN

## 2024-06-03 MED ORDER — PHENYLEPHRINE HCL-NACL 20-0.9 MG/250ML-% IV SOLN
INTRAVENOUS | Status: DC | PRN
  Administered 2024-06-03 (×3): 80 ug via INTRAVENOUS

## 2024-06-03 MED ORDER — ACETAMINOPHEN 500 MG PO TABS
1000.0000 mg | ORAL_TABLET | Freq: Four times a day (QID) | ORAL | Status: DC
  Administered 2024-06-03 – 2024-06-05 (×7): 1000 mg via ORAL
  Filled 2024-06-03 (×7): qty 2

## 2024-06-03 MED ORDER — DEXAMETHASONE SOD PHOSPHATE PF 10 MG/ML IJ SOLN
INTRAMUSCULAR | Status: DC | PRN
  Administered 2024-06-03: 10 mg via INTRAVENOUS

## 2024-06-03 MED ORDER — KETOROLAC TROMETHAMINE 30 MG/ML IJ SOLN
30.0000 mg | Freq: Four times a day (QID) | INTRAMUSCULAR | Status: AC
  Administered 2024-06-04: 30 mg via INTRAVENOUS
  Filled 2024-06-03 (×2): qty 1

## 2024-06-03 MED ORDER — DIPHENHYDRAMINE HCL 50 MG/ML IJ SOLN: 12.5000 mg | INTRAMUSCULAR | Status: DC | PRN

## 2024-06-03 MED ORDER — FENTANYL CITRATE (PF) 100 MCG/2ML IJ SOLN: 25.0000 ug | INTRAMUSCULAR | Status: DC | PRN

## 2024-06-03 MED ORDER — PHENYLEPHRINE 80 MCG/ML (10ML) SYRINGE FOR IV PUSH (FOR BLOOD PRESSURE SUPPORT): 80.0000 ug | PREFILLED_SYRINGE | INTRAVENOUS | Status: DC | PRN

## 2024-06-03 MED ORDER — OXYCODONE HCL 5 MG PO TABS
5.0000 mg | ORAL_TABLET | ORAL | Status: DC | PRN
  Administered 2024-06-03: 10 mg via ORAL
  Filled 2024-06-03: qty 2

## 2024-06-03 MED ORDER — FENTANYL-BUPIVACAINE-NACL 0.5-0.125-0.9 MG/250ML-% EP SOLN
12.0000 mL/h | EPIDURAL | Status: DC | PRN
  Administered 2024-06-03: 12 mL/h via EPIDURAL
  Filled 2024-06-03: qty 250

## 2024-06-03 MED ORDER — GENTAMICIN SULFATE 40 MG/ML IJ SOLN
5.0000 mg/kg | INTRAVENOUS | Status: AC
  Administered 2024-06-04: 330 mg via INTRAVENOUS
  Filled 2024-06-03: qty 8.25

## 2024-06-03 MED ORDER — CEFAZOLIN SODIUM-DEXTROSE 2-4 GM/100ML-% IV SOLN: 2.0000 g | INTRAVENOUS | Status: DC

## 2024-06-03 MED ORDER — SENNOSIDES-DOCUSATE SODIUM 8.6-50 MG PO TABS
2.0000 | ORAL_TABLET | Freq: Every day | ORAL | Status: DC
  Administered 2024-06-04 – 2024-06-05 (×2): 2 via ORAL
  Filled 2024-06-03 (×2): qty 2

## 2024-06-03 MED ORDER — ACETAMINOPHEN 500 MG PO TABS: 1000.0000 mg | ORAL_TABLET | Freq: Four times a day (QID) | ORAL | Status: DC

## 2024-06-03 MED ORDER — IBUPROFEN 600 MG PO TABS
600.0000 mg | ORAL_TABLET | Freq: Four times a day (QID) | ORAL | Status: DC
  Administered 2024-06-04 – 2024-06-05 (×5): 600 mg via ORAL
  Filled 2024-06-03 (×5): qty 1

## 2024-06-03 MED ORDER — LACTATED RINGERS IV SOLN: INTRAVENOUS | Status: DC

## 2024-06-03 MED ORDER — SODIUM CHLORIDE 0.9 % IV SOLN
500.0000 mg | Freq: Once | INTRAVENOUS | Status: AC
  Administered 2024-06-03: 500 mg via INTRAVENOUS

## 2024-06-03 MED ORDER — RHO D IMMUNE GLOBULIN 1500 UNIT/2ML IJ SOSY
300.0000 ug | PREFILLED_SYRINGE | Freq: Once | INTRAMUSCULAR | Status: AC
  Administered 2024-06-04: 300 ug via INTRAVENOUS
  Filled 2024-06-03: qty 2

## 2024-06-03 MED ORDER — FENTANYL CITRATE (PF) 100 MCG/2ML IJ SOLN
INTRAMUSCULAR | Status: AC
  Filled 2024-06-03: qty 2

## 2024-06-03 MED ORDER — STERILE WATER FOR IRRIGATION IR SOLN
Status: DC | PRN
  Administered 2024-06-03: 1000 mL

## 2024-06-03 MED ORDER — PRENATAL MULTIVITAMIN CH
1.0000 | ORAL_TABLET | Freq: Every day | ORAL | Status: DC
  Administered 2024-06-04 – 2024-06-05 (×2): 1 via ORAL
  Filled 2024-06-03 (×2): qty 1

## 2024-06-03 MED ORDER — OXYTOCIN-SODIUM CHLORIDE 30-0.9 UT/500ML-% IV SOLN
INTRAVENOUS | Status: DC | PRN
  Administered 2024-06-03: 300 mL via INTRAVENOUS

## 2024-06-03 MED ORDER — CEFAZOLIN SODIUM-DEXTROSE 2-4 GM/100ML-% IV SOLN
2.0000 g | Freq: Three times a day (TID) | INTRAVENOUS | Status: DC
  Administered 2024-06-03: 2 g via INTRAVENOUS
  Filled 2024-06-03 (×2): qty 100

## 2024-06-03 MED ORDER — SOD CITRATE-CITRIC ACID 500-334 MG/5ML PO SOLN: 30.0000 mL | ORAL | Status: DC

## 2024-06-03 MED ORDER — FENTANYL CITRATE (PF) 100 MCG/2ML IJ SOLN
INTRAMUSCULAR | Status: DC | PRN
  Administered 2024-06-03: 100 ug via EPIDURAL

## 2024-06-03 MED ORDER — LIDOCAINE HCL (PF) 1 % IJ SOLN
INTRAMUSCULAR | Status: DC | PRN
  Administered 2024-06-03 (×2): 4 mL via EPIDURAL

## 2024-06-03 MED ORDER — CEFAZOLIN SODIUM-DEXTROSE 2-4 GM/100ML-% IV SOLN
2.0000 g | Freq: Three times a day (TID) | INTRAVENOUS | Status: AC
  Administered 2024-06-03 – 2024-06-04 (×3): 2 g via INTRAVENOUS
  Filled 2024-06-03 (×5): qty 100

## 2024-06-03 MED ORDER — KETOROLAC TROMETHAMINE 30 MG/ML IJ SOLN: 30.0000 mg | Freq: Once | INTRAMUSCULAR | Status: DC

## 2024-06-03 MED ORDER — WITCH HAZEL-GLYCERIN EX PADS: 1.0000 | MEDICATED_PAD | CUTANEOUS | Status: DC | PRN

## 2024-06-03 MED ORDER — NALOXONE HCL 4 MG/10ML IJ SOLN: 1.0000 ug/kg/h | INTRAVENOUS | Status: DC | PRN

## 2024-06-03 MED ORDER — METHYLERGONOVINE MALEATE 0.2 MG/ML IJ SOLN
INTRAMUSCULAR | Status: DC | PRN
  Administered 2024-06-03: .2 mg via INTRAMUSCULAR

## 2024-06-03 MED ORDER — ZOLPIDEM TARTRATE 5 MG PO TABS: 5.0000 mg | ORAL_TABLET | Freq: Every evening | ORAL | Status: DC | PRN

## 2024-06-03 MED ORDER — DIPHENHYDRAMINE HCL 25 MG PO CAPS: 25.0000 mg | ORAL_CAPSULE | Freq: Four times a day (QID) | ORAL | Status: DC | PRN

## 2024-06-03 MED ORDER — LIDOCAINE-EPINEPHRINE (PF) 2 %-1:200000 IJ SOLN
INTRAMUSCULAR | Status: DC | PRN
  Administered 2024-06-03: 3 mL via EPIDURAL
  Administered 2024-06-03: 2 mL via EPIDURAL
  Administered 2024-06-03: 5 mL via EPIDURAL

## 2024-06-03 MED ORDER — LACTATED RINGERS IV BOLUS
1000.0000 mL | Freq: Once | INTRAVENOUS | Status: AC
  Administered 2024-06-03: 1000 mL via INTRAVENOUS

## 2024-06-03 MED ORDER — DROPERIDOL 2.5 MG/ML IJ SOLN: 0.6250 mg | Freq: Once | INTRAMUSCULAR | Status: DC | PRN

## 2024-06-03 MED ORDER — DIPHENHYDRAMINE HCL 25 MG PO CAPS: 25.0000 mg | ORAL_CAPSULE | ORAL | Status: DC | PRN

## 2024-06-03 MED ORDER — LACTATED RINGERS IV SOLN
500.0000 mL | INTRAVENOUS | Status: DC | PRN
  Administered 2024-06-03 (×2): 500 mL via INTRAVENOUS

## 2024-06-03 MED ORDER — NALOXONE HCL 0.4 MG/ML IJ SOLN: 0.4000 mg | INTRAMUSCULAR | Status: DC | PRN

## 2024-06-03 MED ORDER — KETOROLAC TROMETHAMINE 30 MG/ML IJ SOLN
30.0000 mg | Freq: Four times a day (QID) | INTRAMUSCULAR | Status: DC | PRN
  Administered 2024-06-03: 30 mg via INTRAVENOUS

## 2024-06-03 MED ORDER — MENTHOL 3 MG MT LOZG: 1.0000 | LOZENGE | OROMUCOSAL | Status: DC | PRN

## 2024-06-03 MED ORDER — GENTAMICIN SULFATE 40 MG/ML IJ SOLN
5.0000 mg/kg | INTRAVENOUS | Status: DC
  Administered 2024-06-03: 329.2 mg via INTRAVENOUS
  Filled 2024-06-03: qty 8.25

## 2024-06-03 MED ORDER — SODIUM CHLORIDE 0.9 % IV SOLN
INTRAVENOUS | Status: AC
  Filled 2024-06-03: qty 5

## 2024-06-03 MED ORDER — KETOROLAC TROMETHAMINE 30 MG/ML IJ SOLN: 30.0000 mg | Freq: Four times a day (QID) | INTRAMUSCULAR | Status: DC | PRN

## 2024-06-03 MED ORDER — ACETAMINOPHEN 500 MG PO TABS
1000.0000 mg | ORAL_TABLET | Freq: Four times a day (QID) | ORAL | Status: DC | PRN
  Administered 2024-06-03: 1000 mg via ORAL
  Filled 2024-06-03: qty 2

## 2024-06-03 MED ORDER — AZITHROMYCIN 200 MG/5ML PO SUSR: 500.0000 mg | Freq: Once | ORAL | Status: DC

## 2024-06-03 MED ORDER — EPHEDRINE 5 MG/ML INJ: 10.0000 mg | INTRAVENOUS | Status: DC | PRN

## 2024-06-03 MED ORDER — DIBUCAINE (PERIANAL) 1 % EX OINT: 1.0000 | TOPICAL_OINTMENT | CUTANEOUS | Status: DC | PRN

## 2024-06-03 MED ORDER — LACTATED RINGERS IV SOLN
500.0000 mL | Freq: Once | INTRAVENOUS | Status: AC
  Administered 2024-06-03: 500 mL via INTRAVENOUS

## 2024-06-03 MED ORDER — ONDANSETRON HCL 4 MG/2ML IJ SOLN
4.0000 mg | Freq: Three times a day (TID) | INTRAMUSCULAR | Status: DC | PRN
  Filled 2024-06-03: qty 2

## 2024-06-03 MED ORDER — KETOROLAC TROMETHAMINE 30 MG/ML IJ SOLN
INTRAMUSCULAR | Status: AC
  Filled 2024-06-03: qty 1

## 2024-06-03 MED ORDER — SODIUM CHLORIDE 0.9 % IR SOLN
Status: DC | PRN
  Administered 2024-06-03: 1000 mL

## 2024-06-03 MED ORDER — OXYTOCIN-SODIUM CHLORIDE 30-0.9 UT/500ML-% IV SOLN
2.5000 [IU]/h | INTRAVENOUS | Status: AC
  Administered 2024-06-03: 2.5 [IU]/h via INTRAVENOUS
  Filled 2024-06-03: qty 500

## 2024-06-03 MED ORDER — COCONUT OIL OIL: 1.0000 | TOPICAL_OIL | Status: DC | PRN

## 2024-06-03 MED ORDER — MORPHINE SULFATE (PF) 0.5 MG/ML IJ SOLN
INTRAMUSCULAR | Status: DC | PRN
  Administered 2024-06-03: 3 mg via EPIDURAL

## 2024-06-03 MED ORDER — POLYSACCHARIDE IRON COMPLEX 150 MG PO CAPS
150.0000 mg | ORAL_CAPSULE | Freq: Every day | ORAL | Status: DC
  Administered 2024-06-03 – 2024-06-05 (×3): 150 mg via ORAL
  Filled 2024-06-03 (×3): qty 1

## 2024-06-03 MED ORDER — SIMETHICONE 80 MG PO CHEW
80.0000 mg | CHEWABLE_TABLET | Freq: Three times a day (TID) | ORAL | Status: DC
  Administered 2024-06-04 – 2024-06-05 (×4): 80 mg via ORAL
  Filled 2024-06-03 (×4): qty 1

## 2024-06-03 NOTE — Progress Notes (Addendum)
 Tiffany Wolf is a 39 y.o. G2P0010 at [redacted]w[redacted]d admitted for PROM  Subjective: Denies pain with contractions. Continued LOF. Denies VB. + FM  Objective: BP 114/72   Pulse 70   Temp 97.7 F (36.5 C) (Oral)   Resp 16   Ht 5' 5 (1.651 m)   Wt 79 kg   SpO2 99%   BMI 28.97 kg/m  No intake/output data recorded. No intake/output data recorded.  FHT: FHR 130, moderate variability, accelerations +, no deceleration. Toco: irregular Cat 1   SVE:   Dilation: 4 Effacement (%): 50 Station: -2 Exam by:: Tiffany Wolf  Labs: Lab Results  Component Value Date   WBC 8.1 06/02/2024   HGB 12.3 06/02/2024   HCT 34.6 (L) 06/02/2024   MCV 95.1 06/02/2024   PLT 209 06/02/2024    Assessment / Plan: 39 yo G2P0010 at [redacted]w[redacted]d under-going induction of labor due to PROM  Labor: s/p PO 50 mcg cytotec  x 2, foley bulb. Now on pitocin  10mU/min. Continue to titrate per protocol, forebag AROM'ed. Given >24hr ROM, counseled re abx, will start azithro Fetal Wellbeing:  Category I Pain Control:  IV meds until 6 cm or epidural PRN   Tiffany CHRISTELLA Guppy, MD 06/03/2024, 7:57 AM

## 2024-06-03 NOTE — Op Note (Signed)
 C-Section Operative Note  Date: 06/03/24  Preoperative Diagnosis: IUP @ 38 6/7, prolonged rupture of membranes, chorioamnionitis Postoperative Diagnosis: same as above Procedure: primary low transverse cesarean section without extension Surgeon: Lavonia Guppy, MD Indication: non-reassuring FHR remote from delivery Findings: Viable female infant weighing 3060 with APGARS of 9 and 9 at 1 and 5 minutes, respectively. Normal appearing uterus, bilateral fallopian tubes and ovaries. Tight double nuchal noted upon entry, thin tightly coiled umbilical cord noted Specimens: placenta to pathology (palpably warm) EBL 289 IVF 800 UOP 50  Consent:  R/B/A of cesarean section discussed with patient. Alternative would be vaginal delivery which would mean shorter postpartum stay and decreased risk of bleeding. Risks of section include infection of the uterus, pelvic organs, or skin, inadvertent injury to internal organs, such as bowel or bladder. If there is major injury, extensive surgery may be required. If injury is minor, it may be treated with relative ease. Discussed possibility of excessive blood loss and transfusion. If bleeding cannot be controlled using medical or minor surgical methods, a cesarean hysterectomy may be performed which would mean no future fertility. Patient accepts the possibility of blood transfusion, if necessary. Patient understands and agrees to move forward with section.   Operative Procedure: Patient was taken to the operating room where epidural anesthesia was found to be adequate by Allis clamp test. She was prepped and draped in the normal sterile fashion in the dorsal supine position with a leftward tilt. An appropriate time out was performed. A Pfannenstiel skin incision was then made with the scalpel and carried through to the underlying layer of fascia by sharp dissection. The fascia was nicked in the midline and the incision was extended laterally bluntly. The superior and  inferior aspects of fascia were bluntly dissected off of rectus bellies. Rectus muscles were separated in the midline and the peritoneal cavity entered bluntly. The peritoneal incision was then extended both superiorly and inferiorly with careful attention to avoid both bowel and bladder. The Alexis self-retaining wound retractor was then placed within the incision and the lower uterine segment exposed. The bladder flap was developed with Metzenbaum scissors and pushed away from the lower uterine segment. The lower uterine segment was then incised in a low transverse fashion and the cavity itself entered bluntly. The incision was extended bluntly. Amniotic sac was ruptured and fluid was noted to be clear in color. Double nuchal noted. The infant's head was then lifted and delivered from the incision without difficulty using the standard movements. The remainder of the infant delivered and the nose and mouth bulb suctioned with the cord clamped and cut as well. The infant was handed off to NICU. The placenta was then spontaneously expressed from the uterus, palpably warm, and the uterus cleared of all clots and debris with moist lap sponge. TXA given post-placental delivery. The uterine incision was then repaired in 2 layers the first layer was a running locked layer of 0-vicryl and the second an imbricating layer of the same suture. The tubes and ovaries were inspected and the gutters cleared of all clots and debris. The uterine incision was inspected and found to be hemostatic. All instruments and sponges as well as the Alexis retractor were then removed from the abdomen. The peritoneum was then reapproximated using 2-0 vicryl suture. The fascia was then closed with 0 Vicryl in a running fashion.  The skin was closed with a subcuticular stitch of 4-0 Vicryl on a Keith needle and then reinforced with Dermabond and a Honeycomb.  At the conclusion of the procedure all instruments and sponge counts were correct. Patient  was taken to the recovery room in good condition with her baby accompanying her skin to skin.  Final fundal rub in PACU yielded reported 800cc clot, IM methergine  given and fundus firm at midline. Close eye, patient stable in PACU  Continue Ancef /Gent for chorioamnionitis

## 2024-06-03 NOTE — Lactation Note (Addendum)
 This note was copied from a baby's chart. Lactation Consultation Note  Patient Name: Tiffany Wolf Unijb'd Date: 06/03/2024 Age:41 hours Reason for consult: Initial assessment;Primapara;Early term 37-38.6wks  P1. Baby was on the breast when LC came in rm by NP assistance. Baby was BF. LC adjusted head more towards mom. Baby popped off and nipple pinched and blister to tip of nipple. Repositioned baby and he fed well. Baby came off relaxed.  Demonstrated swaddle. Placed baby in bassinet. Newborn feeding habits, behavior, STS, I&O, positions, support, props reviewed. Mom encouraged to feed baby 8-12 times/24 hours and with feeding cues.  Answered parents questions. Encouraged to call for assistance as needed.   Maternal Data Has patient been taught Hand Expression?: Yes Does the patient have breastfeeding experience prior to this delivery?: No  Feeding    LATCH Score Latch: Grasps breast easily, tongue down, lips flanged, rhythmical sucking.  Audible Swallowing: A few with stimulation  Type of Nipple: Everted at rest and after stimulation  Comfort (Breast/Nipple): Soft / non-tender (noted blister to tip of Lt. nipple when baby came off)  Hold (Positioning): Assistance needed to correctly position infant at breast and maintain latch.  LATCH Score: 8   Lactation Tools Discussed/Used    Interventions Interventions: Breast feeding basics reviewed;Assisted with latch;Skin to skin;Breast massage;Hand express;Breast compression;Adjust position;Support pillows;Position options;Expressed milk;Education;LC Services brochure  Discharge Discharge Education: Outpatient recommendation Pump: Hands Free (Mom Cozy)  Consult Status Consult Status: Follow-up Date: 06/03/24 Follow-up type: In-patient    [name] 06/03/2024, 9:19 PM

## 2024-06-03 NOTE — Anesthesia Preprocedure Evaluation (Signed)
"                                    Anesthesia Evaluation  Patient identified by MRN, date of birth, ID band Patient awake    Reviewed: Allergy & Precautions, Patient's Chart, lab work & pertinent test results  History of Anesthesia Complications Negative for: history of anesthetic complications  Airway Mallampati: II  TM Distance: >3 FB Neck ROM: Full    Dental no notable dental hx.    Pulmonary former smoker   Pulmonary exam normal        Cardiovascular Normal cardiovascular exam+ dysrhythmias Supra Ventricular Tachycardia      Neuro/Psych negative neurological ROS     GI/Hepatic negative GI ROS, Neg liver ROS,,,  Endo/Other   Hyperthyroidism   Renal/GU negative Renal ROS     Musculoskeletal negative musculoskeletal ROS (+)    Abdominal   Peds  Hematology negative hematology ROS (+)   Anesthesia Other Findings   Reproductive/Obstetrics (+) Pregnancy                              Anesthesia Physical Anesthesia Plan  ASA: 2  Anesthesia Plan: Epidural   Post-op Pain Management:    Induction:   PONV Risk Score and Plan: Treatment may vary due to age or medical condition  Airway Management Planned: Natural Airway  Additional Equipment: Fetal Monitoring  Intra-op Plan:   Post-operative Plan:   Informed Consent: I have reviewed the patients History and Physical, chart, labs and discussed the procedure including the risks, benefits and alternatives for the proposed anesthesia with the patient or authorized representative who has indicated his/her understanding and acceptance.       Plan Discussed with:   Anesthesia Plan Comments:          Anesthesia Quick Evaluation  "

## 2024-06-03 NOTE — Progress Notes (Addendum)
 Persistent category 2 tracing - baseline 180, min var, now with rec decels. CE neg for evidence of cord presence. IV antibiotics and fluids. Patient now warm internally BP 114/61   Pulse (!) 116   Temp 99.7 F (37.6 C) (Oral)   Resp 16   Ht 5' 5 (1.651 m)   Wt 79 kg   SpO2 99%   BMI 28.97 kg/m  Discussed possible IUPC placement with amnioinfusion for variables and reassessing in 1hr vs proceeding with PLTCS at this time esp in setting for prolonged ROM (now at 30hrs) with no cervical change in 12 hours. Patient and husband elect for proceeding with PLTCS. Continue Ancef /Gent, pla  azithro R/B/A of cesarean section discussed with patient. Alternative would be vaginal delivery which would mean shorter postpartum stay and decreased risk of bleeding. Risks of section include infection of the uterus, pelvic organs, or skin, inadvertent injury to internal organs, such as bowel or bladder. If there is major injury, extensive surgery may be required. If injury is minor, it may be treated with relative ease. Discussed possibility of excessive blood loss and transfusion. If bleeding cannot be controlled using medical or minor surgical methods, a cesarean hysterectomy may be performed which would mean no future fertility. Patient accepts the possibility of blood transfusion, if necessary. Patient understands and agrees to move forward with section. Plan on having NICU present given tracing as well as IVF pregnancy with known VSD. Spoke with pharmacy regarding azithromycin  timing earlier in day as well as current ancef /gent for presumed chorioamnionitis. No need for secondary dosing in terms of laboring section. Pharmacy agreed with plan. Proceed to OR

## 2024-06-03 NOTE — Progress Notes (Addendum)
 Presented to patient room due to baseline change. Patient also notes feeling hot and cold and having shivers, new nausea and headache BP 135/76   Pulse (!) 109   Temp 98 F (36.7 C) (Oral)   Resp 18   Ht 5' 5 (1.651 m)   Wt 79 kg   SpO2 99%   BMI 28.97 kg/m  CE still 4/60/-2, TOCO q2-44m, couplet, pitocin  at 89mU/min Baseline 170, mind ot mod var, occ variable decel  Given fetal and maternal tachycardia, concern for developing infection. Will begin Ancef /gent per protocol, IVF fluids and PO tylenol  ordered (PCN allergy reported)

## 2024-06-03 NOTE — Anesthesia Procedure Notes (Signed)
 Epidural Patient location during procedure: OB Start time: 06/03/2024 9:03 AM End time: 06/03/2024 9:06 AM  Staffing Anesthesiologist: Paul Lamarr BRAVO, MD Performed: anesthesiologist   Preanesthetic Checklist Completed: patient identified, IV checked, risks and benefits discussed, monitors and equipment checked, pre-op evaluation and timeout performed  Epidural Patient position: sitting Prep: DuraPrep and site prepped and draped Patient monitoring: continuous pulse ox, blood pressure and heart rate Approach: midline Location: L3-L4 Injection technique: LOR air  Needle:  Needle type: Tuohy  Needle gauge: 17 G Needle length: 9 cm Needle insertion depth: 5 cm Catheter type: closed end flexible Catheter size: 19 Gauge Catheter at skin depth: 10 cm Test dose: negative and Other (1% lidocaine )  Assessment Events: blood not aspirated, no cerebrospinal fluid, injection not painful, no injection resistance, no paresthesia and negative IV test  Additional Notes Patient identified. Risks, benefits, and alternatives discussed with patient including but not limited to bleeding, infection, nerve damage, paralysis, failed block, incomplete pain control, headache, blood pressure changes, nausea, vomiting, reactions to medication, itching, and postpartum back pain. Confirmed with bedside nurse the patient's most recent platelet count. Confirmed with patient that they are not currently taking any anticoagulation, have any bleeding history, or any family history of bleeding disorders. Patient expressed understanding and wished to proceed. All questions were answered. Sterile technique was used throughout the entire procedure. Please see nursing notes for vital signs.   Crisp LOR on first pass. Test dose was given through epidural catheter and negative prior to continuing to dose epidural or start infusion. Warning signs of high block given to the patient including shortness of breath,  tingling/numbness in hands, complete motor block, or any concerning symptoms with instructions to call for help. Patient was given instructions on fall risk and not to get out of bed. All questions and concerns addressed with instructions to call with any issues or inadequate analgesia.  Reason for block:procedure for pain

## 2024-06-03 NOTE — Telephone Encounter (Signed)
 Preadmission screen

## 2024-06-03 NOTE — Anesthesia Postprocedure Evaluation (Signed)
"   Anesthesia Post Note  Patient: Tiffany Wolf  Procedure(s) Performed: CESAREAN DELIVERY (Abdomen)     Patient location during evaluation: PACU Anesthesia Type: Epidural Level of consciousness: awake and alert Pain management: pain level controlled Vital Signs Assessment: post-procedure vital signs reviewed and stable Respiratory status: spontaneous breathing, nonlabored ventilation and respiratory function stable Cardiovascular status: blood pressure returned to baseline Postop Assessment: epidural receding, no apparent nausea or vomiting, no headache and no backache Anesthetic complications: no   No notable events documented.  Last Vitals:  Vitals:   06/03/24 1610 06/03/24 1710  BP: (!) 88/55 (!) 93/59  Pulse: (!) 104 (!) 104  Resp: 16 16  Temp: 36.4 C 37.1 C  SpO2: 99% 97%    Last Pain:  Vitals:   06/03/24 1710  TempSrc: Oral  PainSc: 6    Pain Goal:                   Vertell Row      "

## 2024-06-03 NOTE — Transfer of Care (Signed)
 Immediate Anesthesia Transfer of Care Note  Patient: Tiffany Wolf  Procedure(s) Performed: CESAREAN DELIVERY (Abdomen)  Patient Location: PACU  Anesthesia Type:Epidural  Level of Consciousness: awake, alert , and oriented  Airway & Oxygen Therapy: Patient Spontanous Breathing  Post-op Assessment: Report given to RN and Post -op Vital signs reviewed and stable  Post vital signs: Reviewed and stable  Last Vitals:  Vitals Value Taken Time  BP 103/59 06/03/24 15:05  Temp 36.8 C 06/03/24 15:05  Pulse 113 06/03/24 15:08  Resp 18 06/03/24 15:08  SpO2 100 % 06/03/24 15:08  Vitals shown include unfiled device data.  Last Pain:  Vitals:   06/03/24 1505  TempSrc: Oral  PainSc:          Complications: No notable events documented.

## 2024-06-03 NOTE — Progress Notes (Signed)
 Since PACU fundal rub, minimal lochia. Fundus firm, mild cramping but otherwise feeling voerall well BP (!) 97/57 (BP Location: Left Arm)   Pulse 98   Temp 98.7 F (37.1 C) (Oral)   Resp 17   Ht 5' 5 (1.651 m)   Wt 79 kg   SpO2 96%   Breastfeeding Unknown   BMI 28.97 kg/m  CBC routine POD#1, patient likely equilibrating from large immediate postop EBL.  Total EBL weighed as 977cc.

## 2024-06-04 LAB — COMPREHENSIVE METABOLIC PANEL WITH GFR
ALT: 15 U/L (ref 0–44)
AST: 28 U/L (ref 15–41)
Albumin: 2.5 g/dL — ABNORMAL LOW (ref 3.5–5.0)
Alkaline Phosphatase: 74 U/L (ref 38–126)
Anion gap: 8 (ref 5–15)
BUN: 11 mg/dL (ref 6–20)
CO2: 23 mmol/L (ref 22–32)
Calcium: 8.1 mg/dL — ABNORMAL LOW (ref 8.9–10.3)
Chloride: 102 mmol/L (ref 98–111)
Creatinine, Ser: 0.75 mg/dL (ref 0.44–1.00)
GFR, Estimated: >60 mL/min
Glucose, Bld: 85 mg/dL (ref 70–99)
Potassium: 3.7 mmol/L (ref 3.5–5.1)
Sodium: 133 mmol/L — ABNORMAL LOW (ref 135–145)
Total Bilirubin: 0.2 mg/dL (ref 0.0–1.2)
Total Protein: 5 g/dL — ABNORMAL LOW (ref 6.5–8.1)

## 2024-06-04 LAB — CBC
HCT: 22.8 % — ABNORMAL LOW (ref 36.0–46.0)
HCT: 21.7 % — ABNORMAL LOW (ref 36.0–46.0)
Hemoglobin: 8 g/dL — ABNORMAL LOW (ref 12.0–15.0)
Hemoglobin: 7.5 g/dL — ABNORMAL LOW (ref 12.0–15.0)
MCH: 33.9 pg (ref 26.0–34.0)
MCH: 33.9 pg (ref 26.0–34.0)
MCHC: 35.1 g/dL (ref 30.0–36.0)
MCHC: 34.6 g/dL (ref 30.0–36.0)
MCV: 96.6 fL (ref 80.0–100.0)
MCV: 98.2 fL (ref 80.0–100.0)
Platelets: 167 10*3/uL (ref 150–400)
Platelets: 156 10*3/uL (ref 150–400)
RBC: 2.36 MIL/uL — ABNORMAL LOW (ref 3.87–5.11)
RBC: 2.21 MIL/uL — ABNORMAL LOW (ref 3.87–5.11)
RDW: 13.2 % (ref 11.5–15.5)
RDW: 13.2 % (ref 11.5–15.5)
WBC: 30.6 10*3/uL — ABNORMAL HIGH (ref 4.0–10.5)
WBC: 24.5 10*3/uL — ABNORMAL HIGH (ref 4.0–10.5)
nRBC: 0 % (ref 0.0–0.2)
nRBC: 0 % (ref 0.0–0.2)

## 2024-06-04 LAB — CBC WITH DIFFERENTIAL/PLATELET
Basophils Relative: 0 10*3/uL (ref 0.0–0.1)
Eosinophils Absolute: 0 10*3/uL (ref 0.0–0.5)
Eosinophils Relative: 0 10*3/uL (ref 0.0–0.5)
HCT: 24.2 % — ABNORMAL LOW (ref 36.0–46.0)
Hemoglobin: 8.7 g/dL — ABNORMAL LOW (ref 12.0–15.0)
Lymphocytes Relative: 1 %
Lymphs Abs: 0.3 10*3/uL — AB (ref 0.7–4.0)
MCH: 34.5 pg — ABNORMAL HIGH (ref 26.0–34.0)
MCHC: 36 g/dL (ref 30.0–36.0)
MCV: 96 fL (ref 80.0–100.0)
Monocytes Absolute: 0 10*3/uL (ref 0.1–1.0)
Monocytes Relative: 0.3 10*3/uL (ref 0.1–1.0)
Monocytes Relative: 1 10*3/uL (ref 0.7–4.0)
Neutro Abs: 27.9 10*3/uL — AB (ref 1.7–7.7)
Neutrophils Relative %: 98 %
Platelets: 164 10*3/uL (ref 150–400)
RBC: 2.52 MIL/uL — ABNORMAL LOW (ref 3.87–5.11)
RDW: 12.9 % (ref 11.5–15.5)
WBC: 28.5 10*3/uL — ABNORMAL HIGH (ref 4.0–10.5)
nRBC: 0 % (ref 0.0–0.2)

## 2024-06-04 MED ORDER — LACTATED RINGERS IV SOLN: INTRAVENOUS | Status: AC

## 2024-06-04 MED ORDER — LACTATED RINGERS IV BOLUS
500.0000 mL | Freq: Once | INTRAVENOUS | Status: AC
  Administered 2024-06-04: 500 mL via INTRAVENOUS

## 2024-06-04 MED ORDER — METHYLPREDNISOLONE SODIUM SUCC 125 MG IJ SOLR: 125.0000 mg | Freq: Once | INTRAMUSCULAR | Status: DC | PRN

## 2024-06-04 MED ORDER — SODIUM CHLORIDE 0.9 % IV SOLN
500.0000 mg | Freq: Once | INTRAVENOUS | Status: AC
  Administered 2024-06-04: 500 mg via INTRAVENOUS
  Filled 2024-06-04: qty 25

## 2024-06-04 MED ORDER — SODIUM CHLORIDE 0.9 % IV SOLN: INTRAVENOUS | Status: AC | PRN

## 2024-06-04 MED ORDER — EPINEPHRINE 0.3 MG/0.3ML IJ SOAJ: 0.3000 mg | Freq: Once | INTRAMUSCULAR | Status: DC | PRN

## 2024-06-04 MED ORDER — DIPHENHYDRAMINE HCL 50 MG/ML IJ SOLN: 25.0000 mg | Freq: Once | INTRAMUSCULAR | Status: DC | PRN

## 2024-06-04 MED ORDER — ALBUTEROL SULFATE (2.5 MG/3ML) 0.083% IN NEBU: 2.5000 mg | INHALATION_SOLUTION | Freq: Once | RESPIRATORY_TRACT | Status: DC | PRN

## 2024-06-04 MED ORDER — SODIUM CHLORIDE 0.9 % IV BOLUS: 500.0000 mL | Freq: Once | INTRAVENOUS | Status: DC | PRN

## 2024-06-04 NOTE — Progress Notes (Signed)
 IV iron  was running accurately, Then stopped. Dr. Diedre notified,  and is okay with Ancef  and Cyndia being pushed back until IV iron  is complete.

## 2024-06-04 NOTE — Progress Notes (Signed)
 This nurse called Dr. Diedre. Verbal orders were given to run LR @ 100 for maintenance, recheck BP at 1200 and regroup after BP. Verbal to also leave foley in until regrouping at 1200. Per. Dr. Diedre.

## 2024-06-04 NOTE — Progress Notes (Signed)
 Late entry progress note  Bps soft throughout the day today without tachycardia. Patient ambulating without lightheadedness or dizziness. She was assessed at bedside. Well appearing, sitting up in bed. Abdomen soft, nontender. Urine clear with excellent output. Remains afebrile. CBC was repeated mid-day, Hgb 7.5 (from 8.1), WBC downtrending. IV iron  ordered along with IV fluids. She is completing her last dose of IV antibiotics today.  Not suspicious of intraabdominal bleed at this time and low concern for sepsis.  Will continue to monitor.   EMERSON Chapel MD 06/04/24 6:06 PM

## 2024-06-04 NOTE — Progress Notes (Addendum)
 This nurse called MD Diedre, and informed her of pt. BP @ 0730 or 87/53. The pt is not symptomatic, however has not ate or ambulated at this time, foley is still in. This nurse explained that IV was half way out of pt. Arm and occluded, and had to be removed. This nurse went ahead and placed a STAT order for IV consult, considering low bp and antibiotics that are needed. The pt. Has ordered breakfast, waiting for food to arrive. This nurse plans to give bolus of LR as soon as IV is in, along with antibiotics. Pt. Bleeding is scant, and honeycomb is clean dry and intact, uterus is firm. All information above was explained to Dr. Diedre and she agreed.   Any further questions please let this nurse know.

## 2024-06-04 NOTE — Progress Notes (Signed)
 Subjective: Postpartum Day 1: Cesarean Delivery Patient feeling okay this morning. Reports some episodes of lightheadedness. Bps have been 80s/50s this morning. IV fluid bolus was ordered but IV infiltrated. IV team has just replaced IV and fluids have not yet been started as of my exam.   She ambulated earlier this morning. Pain is moderately controlled. Foley is in place.    Objective: Patient Vitals for the past 24 hrs:  BP Temp Temp src Pulse Resp SpO2  06/04/24 1021 (!) 84/48 97.7 F (36.5 C) Oral 72 16 --  06/04/24 0700 (!) 87/53 98.3 F (36.8 C) Oral 76 16 --  06/04/24 0341 (!) 103/59 99 F (37.2 C) Oral 95 18 --  06/03/24 2330 99/60 97.9 F (36.6 C) Axillary 98 16 100 %  06/03/24 2127 101/62 -- -- -- -- --  06/03/24 1930 (!) 95/58 98.7 F (37.1 C) Oral 99 17 98 %  06/03/24 1822 (!) 97/57 98.7 F (37.1 C) Oral 98 17 96 %  06/03/24 1710 (!) 93/59 98.8 F (37.1 C) Oral (!) 104 16 97 %  06/03/24 1610 (!) 88/55 97.6 F (36.4 C) Oral (!) 104 16 99 %  06/03/24 1600 (!) 88/52 -- -- 100 10 99 %  06/03/24 1545 91/76 -- -- (!) 111 18 100 %  06/03/24 1530 93/63 -- -- (!) 113 17 99 %  06/03/24 1516 -- -- -- (!) 112 14 100 %  06/03/24 1515 107/65 -- -- (!) 121 20 100 %  06/03/24 1505 (!) 103/59 98.2 F (36.8 C) Oral (!) 117 16 100 %  06/03/24 1326 -- -- -- -- -- 96 %  06/03/24 1321 -- -- -- -- -- 97 %  06/03/24 1311 -- -- -- -- -- 99 %  06/03/24 1301 114/61 -- -- (!) 116 16 --  06/03/24 1252 -- 99.7 F (37.6 C) Oral -- -- --  06/03/24 1234 135/76 -- -- (!) 109 -- --  06/03/24 1216 118/82 -- -- (!) 120 18 --  06/03/24 1130 -- 98 F (36.7 C) Oral -- -- --  06/03/24 1101 109/66 -- -- 75 16 --   UOP this AM since 0700  Physical Exam:  General: alert, cooperative, and no distress Lochia: appropriate Uterine Fundus: firm Incision: healing well, no significant drainage, no dehiscence, no significant erythema DVT Evaluation: No evidence of DVT seen on physical  exam.  Recent Labs    06/03/24 2239 06/04/24 0423  HGB 8.7* 8.0*  HCT 24.2* 22.8*    Assessment/Plan: Letica H Dutan G2P1011 POD#1 sp primary CS at [redacted]w[redacted]d for NRFHT 1. PPC: routine PP care 2. Hypotensive: minimally symptomatic, poor PO intake, will intiate IV fluid bolus followed by 164mL/hour maintenance. Maintain foley for now. CMP normal. Hgb 8.0. If no improvement will repeat CBC.  3. ABLA, clinically significant: IV Iron  ordered 4. Rh neg, baby Rh pos: rhogam ordered 5. Desires circumcision, will defer until tomorrow 6. Chorio: will complete 24 hours ancef /gent today. WBC 30 this AM, will repeat tomorrow. No fever/chills.   Rosaline FORBES Chapel 06/04/2024, 10:31 AM

## 2024-06-05 ENCOUNTER — Other Ambulatory Visit (HOSPITAL_COMMUNITY): Payer: Self-pay

## 2024-06-05 LAB — RH IG WORKUP (INCLUDES ABO/RH)
Fetal Screen: NEGATIVE
Gestational Age(Wks): 38.6
Unit division: 0

## 2024-06-05 MED ORDER — GABAPENTIN 100 MG PO CAPS
100.0000 mg | ORAL_CAPSULE | Freq: Three times a day (TID) | ORAL | 0 refills | Status: AC | PRN
  Filled 2024-06-05: qty 90, 30d supply, fill #0

## 2024-06-05 MED ORDER — IBUPROFEN 800 MG PO TABS
800.0000 mg | ORAL_TABLET | Freq: Three times a day (TID) | ORAL | 1 refills | Status: AC | PRN
  Filled 2024-06-05: qty 60, 20d supply, fill #0

## 2024-06-05 MED ORDER — SENNOSIDES-DOCUSATE SODIUM 8.6-50 MG PO TABS: 1.0000 | ORAL_TABLET | Freq: Every day | ORAL | Status: AC | PRN

## 2024-06-05 MED ORDER — POLYSACCHARIDE IRON COMPLEX 150 MG PO CAPS
150.0000 mg | ORAL_CAPSULE | Freq: Every day | ORAL | 0 refills | Status: AC
  Filled 2024-06-05: qty 30, 30d supply, fill #0

## 2024-06-05 NOTE — Discharge Summary (Signed)
 "    Postpartum Discharge Summary  Date of Service updated 06/05/24      Patient Name: Tiffany Wolf DOB: [birth year] MRN: [id number]  Date of admission: 06/02/2024 Delivery date:06/03/2024 Delivering provider: SUDIE LAVONIA HERO Date of discharge: 06/05/2024  Admitting diagnosis: Pregnant and not yet delivered in third trimester [Z34.93] PROM Intrauterine pregnancy: [redacted]w[redacted]d     Secondary diagnosis:  Principal Problem:   Pregnant and not yet delivered in third trimester  Additional problems: IVF, AMA, fetal small VSD, RH neg    Discharge diagnosis: Term Pregnancy Delivered, Anemia, and chorioamnionitis                                              Post partum procedures:rhogam Augmentation: Pitocin , Cytotec , and IP Foley Complications: Intrauterine Inflammation or infection (Chorioamniotis)  Hospital course: Induction of Labor With Cesarean Section   39 y.o. yo G2P1011 at [redacted]w[redacted]d was admitted to the hospital 06/02/2024 for induction of labor for PROM. Patient had a labor course significant for development of chorioamnionitis. The patient went for cesarean section due to Non-Reassuring FHR. Delivery details are as follows: Membrane Rupture Time/Date: 5:40 AM,06/02/2024  Delivery Method:C-Section, Low Transverse Operative Delivery:N/A Details of operation can be found in separate operative Note.  Patient had a postpartum course complicated by anemia (received IV iron ). She completed 24 hours of antibiotics for chorioamnionitis. She is ambulating, tolerating a regular diet, passing flatus, and urinating well.  Patient is discharged home in stable condition on 06/05/24.      Newborn Data: Birth date:06/03/2024 Birth time:2:13 PM Gender:Female Living status:Living Apgars:9 ,9  Weight:3060 g                               Magnesium Sulfate received: No BMZ received: No Rhophylac :Yes MMR:N/A T-DaP:Given prenatally Flu: Yes RSV Vaccine received: No Transfusion:No Immunizations  administered:  There is no immunization history on file for this patient.  Physical exam  Vitals:   06/04/24 1223 06/04/24 1910 06/04/24 1935 06/05/24 0622  BP: (!) 89/57 104/60 115/66 105/64  Pulse: 86 87 96 73  Resp: 16 16 18 18   Temp:   99 F (37.2 C) 98.3 F (36.8 C)  TempSrc:   Oral Oral  SpO2:   98% 100%  Weight:      Height:       General: alert, cooperative, and no distress Lochia: appropriate Uterine Fundus: firm Incision: Healing well with no significant drainage DVT Evaluation: No evidence of DVT seen on physical exam. Labs: Lab Results  Component Value Date   WBC 24.5 (H) 06/04/2024   HGB 7.5 (L) 06/04/2024   HCT 21.7 (L) 06/04/2024   MCV 98.2 06/04/2024   PLT 156 06/04/2024      Latest Ref Rng & Units 06/04/2024    7:24 AM  CMP  Glucose 70 - 99 mg/dL 85   BUN 6 - 20 mg/dL 11   Creatinine 9.55 - 1.00 mg/dL 9.24   Sodium 864 - 854 mmol/L 133   Potassium 3.5 - 5.1 mmol/L 3.7   Chloride 98 - 111 mmol/L 102   CO2 22 - 32 mmol/L 23   Calcium 8.9 - 10.3 mg/dL 8.1   Total Protein 6.5 - 8.1 g/dL 5.0   Total Bilirubin 0.0 - 1.2 mg/dL 0.2   Alkaline Phos 38 - 126  U/L 74   AST 15 - 41 U/L 28   ALT 0 - 44 U/L 15    Edinburgh Score:     No data to display            After visit meds:  Allergies as of 06/05/2024       Reactions   Penicillins         Medication List     STOP taking these medications    aspirin EC 81 MG tablet   metoprolol  succinate 25 MG 24 hr tablet Commonly known as: Toprol  XL       TAKE these medications    albuterol  108 (90 Base) MCG/ACT inhaler Commonly known as: VENTOLIN  HFA Inhale 1-2 puffs into the lungs every 6 (six) hours as needed for wheezing or shortness of breath.   Ferrex 150 150 MG capsule Generic drug: iron  polysaccharides Take 1 capsule (150 mg total) by mouth daily. Start taking on: June 06, 2024   gabapentin  100 MG capsule Commonly known as: Neurontin  Take 1 capsule (100 mg total) by mouth  3 (three) times daily as needed (pain).   ibuprofen  800 MG tablet Commonly known as: ADVIL  Take 1 tablet (800 mg total) by mouth every 8 (eight) hours as needed.   loratadine 10 MG tablet Commonly known as: CLARITIN Take 10 mg by mouth daily.   prenatal multivitamin Tabs tablet Take 1 tablet by mouth daily at 12 noon.   senna-docusate 8.6-50 MG tablet Commonly known as: Senokot-S Take 1 tablet by mouth daily as needed for mild constipation.         Discharge home in stable condition Infant Feeding: Breast Infant Disposition:home with mother Discharge instruction: per After Visit Summary and Postpartum booklet. Activity: Advance as tolerated. Pelvic rest for 6 weeks.  Diet: routine diet Anticipated Birth Control: Unsure Postpartum Appointment:4 weeks Additional Postpartum F/U: none Future Appointments:No future appointments. Follow up Visit:  Follow-up Information     Ob/Gyn, Landy Stains. Schedule an appointment as soon as possible for a visit in 4 week(s).   Contact information: [address] [place] [address] [phone]                     06/05/2024 Rosaline FORBES Chapel, MD   "

## 2024-06-05 NOTE — Progress Notes (Signed)
 Subjective: Postpartum Day 2: Cesarean Delivery Feeling much better today. Pain is controlled. Ambulating without lightheadedness or dizziness. Voiding, breastfeeding.    Objective: Patient Vitals for the past 24 hrs:  BP Temp Temp src Pulse Resp SpO2  06/05/24 0622 105/64 98.3 F (36.8 C) Oral 73 18 100 %  06/04/24 1935 115/66 99 F (37.2 C) Oral 96 18 98 %  06/04/24 1910 104/60 -- -- 87 16 --  06/04/24 1223 (!) 89/57 -- -- 86 16 --  06/04/24 1021 (!) 84/48 97.7 F (36.5 C) Oral 72 16 --    Physical Exam:  General: alert, cooperative, and no distress Lochia: appropriate Uterine Fundus: firm Incision: healing well, no significant drainage, no dehiscence, no significant erythema DVT Evaluation: No evidence of DVT seen on physical exam.  Recent Labs    06/04/24 0423 06/04/24 1257  HGB 8.0* 7.5*  HCT 22.8* 21.7*    Assessment/Plan: Tiffany Wolf G2P1011 POD#2 sp primary CS at [redacted]w[redacted]d for NRFHT 1. PPC: routine PP care 2. ABLA, clinically significant: s/p IV iron  yesterday 4. Rh neg, baby Rh pos: s/p Rhogam 5. Desires neonatal circumcision, R/B/A of procedure discussed at length. Pt understands that neonatal circumcision is not considered medically necessary and is elective. The risks include, but are not limited to bleeding, infection, damage to the penis, development of scar tissue, and having to have it redone at a later date. Pt understands theses risks and wishes to proceed.  6. Chorio: s/p 24 hours abx postpartum. WBC downtrended yesterday 30.6 -->  24.5 7. Dispo: meeting discharge milestones, instructions reviewed  Tiffany Wolf 06/05/2024, 9:43 AM

## 2024-06-06 ENCOUNTER — Encounter (HOSPITAL_COMMUNITY): Payer: Self-pay | Admitting: Obstetrics and Gynecology

## 2024-06-07 LAB — SURGICAL PATHOLOGY

## 2024-06-09 ENCOUNTER — Inpatient Hospital Stay (HOSPITAL_COMMUNITY)
